# Patient Record
Sex: Female | Born: 1935
Health system: Southern US, Community
[De-identification: ages and names within clinical notes are randomized; demographics above are authoritative.]

## PROBLEM LIST (undated history)

## (undated) DIAGNOSIS — N189 Chronic kidney disease, unspecified: Secondary | ICD-10-CM

## (undated) DIAGNOSIS — N289 Disorder of kidney and ureter, unspecified: Secondary | ICD-10-CM

## (undated) DIAGNOSIS — E079 Disorder of thyroid, unspecified: Secondary | ICD-10-CM

## (undated) DIAGNOSIS — I10 Essential (primary) hypertension: Secondary | ICD-10-CM

## (undated) DIAGNOSIS — C679 Malignant neoplasm of bladder, unspecified: Secondary | ICD-10-CM

## (undated) DIAGNOSIS — C029 Malignant neoplasm of tongue, unspecified: Secondary | ICD-10-CM

## (undated) DIAGNOSIS — R011 Cardiac murmur, unspecified: Secondary | ICD-10-CM

## (undated) DIAGNOSIS — H409 Unspecified glaucoma: Secondary | ICD-10-CM

## (undated) HISTORY — DX: Disorder of thyroid, unspecified: E07.9

## (undated) HISTORY — DX: Malignant neoplasm of tongue, unspecified: C02.9

## (undated) HISTORY — PX: OTHER SURGICAL HISTORY: SHX169

## (undated) HISTORY — DX: Cardiac murmur, unspecified: R01.1

## (undated) HISTORY — DX: Unspecified glaucoma: H40.9

## (undated) HISTORY — DX: Chronic kidney disease, unspecified: N18.9

## (undated) HISTORY — DX: Essential (primary) hypertension: I10

---

## 1989-07-06 DIAGNOSIS — C679 Malignant neoplasm of bladder, unspecified: Secondary | ICD-10-CM

## 1989-07-06 HISTORY — DX: Malignant neoplasm of bladder, unspecified: C67.9

## 1989-07-06 HISTORY — PX: TRANSURETHRAL RESECTION OF BLADDER: SUR1395

## 2004-04-22 ENCOUNTER — Ambulatory Visit: Payer: Self-pay | Admitting: Family Medicine

## 2004-04-24 ENCOUNTER — Ambulatory Visit: Payer: Self-pay | Admitting: Nurse Practitioner

## 2004-05-01 ENCOUNTER — Ambulatory Visit: Payer: Self-pay | Admitting: General Surgery

## 2004-05-01 LAB — HM COLONOSCOPY

## 2004-06-04 ENCOUNTER — Ambulatory Visit: Payer: Self-pay | Admitting: Internal Medicine

## 2004-07-06 HISTORY — PX: COLONOSCOPY: SHX174

## 2005-06-16 ENCOUNTER — Ambulatory Visit: Payer: Self-pay | Admitting: Family Medicine

## 2006-06-22 ENCOUNTER — Ambulatory Visit: Payer: Self-pay

## 2007-06-15 LAB — HM PAP SMEAR: HM PAP: NEGATIVE

## 2007-07-12 ENCOUNTER — Ambulatory Visit: Payer: Self-pay | Admitting: Family Medicine

## 2008-07-18 ENCOUNTER — Ambulatory Visit: Payer: Self-pay

## 2008-09-03 ENCOUNTER — Ambulatory Visit: Payer: Self-pay

## 2009-09-05 ENCOUNTER — Ambulatory Visit: Payer: Self-pay | Admitting: Family Medicine

## 2010-11-04 ENCOUNTER — Ambulatory Visit: Payer: Self-pay | Admitting: Family Medicine

## 2012-02-02 ENCOUNTER — Ambulatory Visit: Payer: Self-pay | Admitting: Family Medicine

## 2012-10-14 DIAGNOSIS — H348192 Central retinal vein occlusion, unspecified eye, stable: Secondary | ICD-10-CM | POA: Insufficient documentation

## 2012-10-14 DIAGNOSIS — H4050X0 Glaucoma secondary to other eye disorders, unspecified eye, stage unspecified: Secondary | ICD-10-CM | POA: Insufficient documentation

## 2012-10-14 DIAGNOSIS — H269 Unspecified cataract: Secondary | ICD-10-CM | POA: Insufficient documentation

## 2012-10-21 ENCOUNTER — Ambulatory Visit: Payer: Self-pay | Admitting: Ophthalmology

## 2012-10-31 ENCOUNTER — Ambulatory Visit: Payer: Self-pay | Admitting: Ophthalmology

## 2013-02-03 DIAGNOSIS — I10 Essential (primary) hypertension: Secondary | ICD-10-CM | POA: Insufficient documentation

## 2013-02-03 DIAGNOSIS — H9190 Unspecified hearing loss, unspecified ear: Secondary | ICD-10-CM | POA: Insufficient documentation

## 2013-04-10 ENCOUNTER — Ambulatory Visit: Payer: Self-pay | Admitting: Family Medicine

## 2013-07-06 HISTORY — PX: CATARACT EXTRACTION: SUR2

## 2013-11-24 ENCOUNTER — Ambulatory Visit: Payer: Self-pay | Admitting: Family Medicine

## 2013-11-24 LAB — CBC AND DIFFERENTIAL
HCT: 37 % (ref 36–46)
HEMOGLOBIN: 12.1 g/dL (ref 12.0–16.0)
Platelets: 304 10*3/uL (ref 150–399)
WBC: 7.3 10*3/mL

## 2013-11-24 LAB — TSH: TSH: 1.61 u[IU]/mL (ref 0.41–5.90)

## 2013-12-07 LAB — BASIC METABOLIC PANEL
BUN: 38 mg/dL — AB (ref 4–21)
Creatinine: 1.4 mg/dL — AB (ref 0.5–1.1)
Glucose: 89 mg/dL
POTASSIUM: 5.5 mmol/L — AB (ref 3.4–5.3)
SODIUM: 134 mmol/L — AB (ref 137–147)

## 2014-04-12 LAB — LIPID PANEL
Cholesterol: 188 mg/dL (ref 0–200)
HDL: 72 mg/dL — AB (ref 35–70)
LDL CALC: 103 mg/dL
TRIGLYCERIDES: 63 mg/dL (ref 40–160)

## 2014-04-26 ENCOUNTER — Ambulatory Visit: Payer: Self-pay | Admitting: Family Medicine

## 2014-04-26 LAB — HM MAMMOGRAPHY

## 2014-04-30 LAB — HM DEXA SCAN

## 2014-05-09 ENCOUNTER — Ambulatory Visit: Payer: Self-pay | Admitting: General Surgery

## 2014-05-16 ENCOUNTER — Ambulatory Visit: Payer: Self-pay | Admitting: Ophthalmology

## 2014-05-29 ENCOUNTER — Ambulatory Visit: Payer: Self-pay | Admitting: General Surgery

## 2014-07-10 DIAGNOSIS — J069 Acute upper respiratory infection, unspecified: Secondary | ICD-10-CM | POA: Diagnosis not present

## 2014-07-18 DIAGNOSIS — J328 Other chronic sinusitis: Secondary | ICD-10-CM | POA: Diagnosis not present

## 2014-07-23 DIAGNOSIS — H4052X3 Glaucoma secondary to other eye disorders, left eye, severe stage: Secondary | ICD-10-CM | POA: Diagnosis not present

## 2014-08-06 DIAGNOSIS — H4011X1 Primary open-angle glaucoma, mild stage: Secondary | ICD-10-CM | POA: Diagnosis not present

## 2014-08-27 ENCOUNTER — Encounter: Payer: Self-pay | Admitting: General Surgery

## 2014-08-28 ENCOUNTER — Ambulatory Visit (INDEPENDENT_AMBULATORY_CARE_PROVIDER_SITE_OTHER): Payer: Medicare Other | Admitting: General Surgery

## 2014-08-28 ENCOUNTER — Encounter: Payer: Self-pay | Admitting: General Surgery

## 2014-08-28 VITALS — BP 120/66 | HR 72 | Resp 12 | Ht 62.0 in | Wt 112.0 lb

## 2014-08-28 DIAGNOSIS — Z1211 Encounter for screening for malignant neoplasm of colon: Secondary | ICD-10-CM | POA: Diagnosis not present

## 2014-08-28 NOTE — Patient Instructions (Addendum)
Patient to return as needed. 

## 2014-08-28 NOTE — Progress Notes (Signed)
Patient ID: Erin Ibarra, female   DOB: 1930-07-05, 81 y.o.   MRN: [id number]  Chief Complaint  Patient presents with  . Other    colonoscopy    HPI Erin Ibarra is a 81 y.o. female here today for a evauation of a screening colonoscopy. Patient states she is not having any GI problems at this time. Her last colonoscopy was in 2005.  HPI  Past Medical History  Diagnosis Date  . Cancer 1991    Bladder  . Glaucoma   . Heart murmur   . Thyroid disease   . Chronic kidney disease   . Hypertension     Past Surgical History  Procedure Laterality Date  . Cataract extraction  2015  . Colonoscopy  2006  . Transurethral resection of bladder  1991    Family History  Problem Relation Age of Onset  . Prostate cancer Father   . Pancreatic cancer Brother   . Lung cancer Brother     Social History History  Substance Use Topics  . Smoking status: Former Smoker -- 1.00 packs/day for 15 years    Types: Cigarettes  . Smokeless tobacco: Not on file  . Alcohol Use: 0.0 oz/week    0 Standard drinks or equivalent per week    No Known Allergies  Current Outpatient Prescriptions  Medication Sig Dispense Refill  . alendronate (FOSAMAX) 70 MG tablet Take 70 mg by mouth once a week. Take with a full glass of water on an empty stomach.    Marland Kitchen aspirin 81 MG tablet Take 81 mg by mouth daily.    . Calcium Carbonate-Vit D-Min (CALCIUM 600 + MINERALS PO) Take 1 tablet by mouth daily.    . carvedilol (COREG) 25 MG tablet Take 25 mg by mouth 2 (two) times daily.  7  . Fish Oil-Cholecalciferol (FISH OIL + D3) 1000-1000 MG-UNIT CAPS Take by mouth 2 (two) times daily.    . hydrochlorothiazide (HYDRODIURIL) 25 MG tablet Take 25 mg by mouth daily.  3  . levothyroxine (SYNTHROID, LEVOTHROID) 75 MCG tablet Take 75 mcg by mouth daily.  5  . Multiple Vitamins-Minerals (MULTIVITAMIN WITH MINERALS) tablet Take 1 tablet by mouth daily.    . valsartan (DIOVAN) 80 MG tablet   4   No current  facility-administered medications for this visit.    Review of Systems Review of Systems  Constitutional: Negative.   Respiratory: Negative.   Cardiovascular: Negative.   Gastrointestinal: Negative.     Blood pressure 120/66, pulse 72, resp. rate 12, height 5\' 2"  (1.575 m), weight 112 lb (50.803 kg).  Physical Exam Physical Exam  Constitutional: She is oriented to person, place, and time. She appears well-developed and well-nourished.  Eyes: Conjunctivae are normal. No scleral icterus.  Neck: Neck supple.  Lymphadenopathy:    She has no cervical adenopathy.  Neurological: She is alert and oriented to person, place, and time.  Skin: Skin is warm and dry.  Right mastectomy site looks clean and healing well.   Data Reviewed Review of the 05/01/2004 colonoscopy showed a 10 mm polyp in the rectum as well as diverticulosis. Pathology showed a hyperplastic polyp.  Assessment    Asymptomatic patient at no increased risk for colon malignancy.    Plan    It has been elected to defer a follow-up colonoscopy based on her age, family history and lack of GI symptoms. Patient to return as needed.      PCP:  Etheleen Mayhew  08/29/2014, 8:30 PM

## 2014-08-29 DIAGNOSIS — Z1211 Encounter for screening for malignant neoplasm of colon: Secondary | ICD-10-CM | POA: Insufficient documentation

## 2014-09-07 DIAGNOSIS — I10 Essential (primary) hypertension: Secondary | ICD-10-CM | POA: Diagnosis not present

## 2014-09-07 DIAGNOSIS — N261 Atrophy of kidney (terminal): Secondary | ICD-10-CM | POA: Diagnosis not present

## 2014-09-07 DIAGNOSIS — N183 Chronic kidney disease, stage 3 (moderate): Secondary | ICD-10-CM | POA: Diagnosis not present

## 2014-12-26 ENCOUNTER — Other Ambulatory Visit: Payer: Self-pay | Admitting: Family Medicine

## 2014-12-26 DIAGNOSIS — E039 Hypothyroidism, unspecified: Secondary | ICD-10-CM | POA: Insufficient documentation

## 2015-04-12 DIAGNOSIS — H43811 Vitreous degeneration, right eye: Secondary | ICD-10-CM | POA: Diagnosis not present

## 2015-04-23 ENCOUNTER — Other Ambulatory Visit: Payer: Self-pay | Admitting: Family Medicine

## 2015-04-23 DIAGNOSIS — E039 Hypothyroidism, unspecified: Secondary | ICD-10-CM

## 2015-04-23 NOTE — Telephone Encounter (Signed)
Last OV 07/2014

## 2015-05-10 DIAGNOSIS — H43811 Vitreous degeneration, right eye: Secondary | ICD-10-CM | POA: Diagnosis not present

## 2015-05-19 ENCOUNTER — Other Ambulatory Visit: Payer: Self-pay | Admitting: Family Medicine

## 2015-05-19 DIAGNOSIS — M810 Age-related osteoporosis without current pathological fracture: Secondary | ICD-10-CM

## 2015-05-20 DIAGNOSIS — M810 Age-related osteoporosis without current pathological fracture: Secondary | ICD-10-CM | POA: Insufficient documentation

## 2015-05-20 NOTE — Telephone Encounter (Signed)
Last acute ov was on 07/18/14. Osteoporosis 04/30/14

## 2015-06-12 DIAGNOSIS — H348122 Central retinal vein occlusion, left eye, stable: Secondary | ICD-10-CM | POA: Diagnosis not present

## 2015-06-12 DIAGNOSIS — I519 Heart disease, unspecified: Secondary | ICD-10-CM | POA: Diagnosis not present

## 2015-06-12 DIAGNOSIS — Q231 Congenital insufficiency of aortic valve: Secondary | ICD-10-CM | POA: Diagnosis not present

## 2015-06-12 DIAGNOSIS — R0609 Other forms of dyspnea: Secondary | ICD-10-CM | POA: Diagnosis not present

## 2015-06-12 DIAGNOSIS — I10 Essential (primary) hypertension: Secondary | ICD-10-CM | POA: Diagnosis not present

## 2015-07-19 ENCOUNTER — Other Ambulatory Visit: Payer: Self-pay | Admitting: Family Medicine

## 2015-07-19 DIAGNOSIS — E039 Hypothyroidism, unspecified: Secondary | ICD-10-CM

## 2015-07-19 NOTE — Telephone Encounter (Signed)
Last OV 07/2014  Thanks,   -Laura  

## 2015-07-26 ENCOUNTER — Other Ambulatory Visit: Payer: Self-pay | Admitting: Family Medicine

## 2015-07-26 DIAGNOSIS — I10 Essential (primary) hypertension: Secondary | ICD-10-CM

## 2015-07-26 NOTE — Telephone Encounter (Signed)
Last OV 07/2014  Thanks,   -Laura  

## 2015-08-12 DIAGNOSIS — H4052X3 Glaucoma secondary to other eye disorders, left eye, severe stage: Secondary | ICD-10-CM | POA: Diagnosis not present

## 2015-08-19 DIAGNOSIS — H401112 Primary open-angle glaucoma, right eye, moderate stage: Secondary | ICD-10-CM | POA: Diagnosis not present

## 2015-08-31 ENCOUNTER — Other Ambulatory Visit: Payer: Self-pay | Admitting: Family Medicine

## 2015-09-03 ENCOUNTER — Other Ambulatory Visit: Payer: Self-pay | Admitting: Family Medicine

## 2015-09-03 DIAGNOSIS — I10 Essential (primary) hypertension: Secondary | ICD-10-CM

## 2015-09-03 MED ORDER — HYDROCHLOROTHIAZIDE 25 MG PO TABS: 25.0000 mg | ORAL_TABLET | Freq: Every day | ORAL | Status: DC

## 2015-09-03 NOTE — Telephone Encounter (Signed)
Pt contacted office for refill request on the following medications:  hydrochlorothiazide (HYDRODIURIL) 25 MG tablet.  [facility].  [phone]  Pt states she is completely out and has an appointment for Monday 09/09/2015.

## 2015-09-06 DIAGNOSIS — N951 Menopausal and female climacteric states: Secondary | ICD-10-CM | POA: Insufficient documentation

## 2015-09-06 DIAGNOSIS — E871 Hypo-osmolality and hyponatremia: Secondary | ICD-10-CM | POA: Insufficient documentation

## 2015-09-06 DIAGNOSIS — L989 Disorder of the skin and subcutaneous tissue, unspecified: Secondary | ICD-10-CM | POA: Insufficient documentation

## 2015-09-06 DIAGNOSIS — Q231 Congenital insufficiency of aortic valve: Secondary | ICD-10-CM | POA: Insufficient documentation

## 2015-09-06 DIAGNOSIS — Q2381 Bicuspid aortic valve: Secondary | ICD-10-CM | POA: Insufficient documentation

## 2015-09-06 DIAGNOSIS — M542 Cervicalgia: Secondary | ICD-10-CM | POA: Insufficient documentation

## 2015-09-06 DIAGNOSIS — K635 Polyp of colon: Secondary | ICD-10-CM | POA: Insufficient documentation

## 2015-09-06 DIAGNOSIS — R0681 Apnea, not elsewhere classified: Secondary | ICD-10-CM | POA: Insufficient documentation

## 2015-09-06 DIAGNOSIS — K5792 Diverticulitis of intestine, part unspecified, without perforation or abscess without bleeding: Secondary | ICD-10-CM | POA: Insufficient documentation

## 2015-09-06 DIAGNOSIS — K5790 Diverticulosis of intestine, part unspecified, without perforation or abscess without bleeding: Secondary | ICD-10-CM | POA: Insufficient documentation

## 2015-09-06 DIAGNOSIS — R413 Other amnesia: Secondary | ICD-10-CM | POA: Insufficient documentation

## 2015-09-06 DIAGNOSIS — H53009 Unspecified amblyopia, unspecified eye: Secondary | ICD-10-CM | POA: Insufficient documentation

## 2015-09-06 DIAGNOSIS — E7800 Pure hypercholesterolemia, unspecified: Secondary | ICD-10-CM | POA: Insufficient documentation

## 2015-09-06 DIAGNOSIS — C679 Malignant neoplasm of bladder, unspecified: Secondary | ICD-10-CM | POA: Insufficient documentation

## 2015-09-06 DIAGNOSIS — R238 Other skin changes: Secondary | ICD-10-CM

## 2015-09-06 DIAGNOSIS — M549 Dorsalgia, unspecified: Secondary | ICD-10-CM | POA: Insufficient documentation

## 2015-09-09 ENCOUNTER — Ambulatory Visit (INDEPENDENT_AMBULATORY_CARE_PROVIDER_SITE_OTHER): Payer: Medicare Other | Admitting: Family Medicine

## 2015-09-09 ENCOUNTER — Encounter: Payer: Self-pay | Admitting: Family Medicine

## 2015-09-09 VITALS — BP 108/58 | HR 72 | Temp 97.8°F | Resp 16 | Wt 111.0 lb

## 2015-09-09 DIAGNOSIS — E875 Hyperkalemia: Secondary | ICD-10-CM | POA: Insufficient documentation

## 2015-09-09 DIAGNOSIS — I10 Essential (primary) hypertension: Secondary | ICD-10-CM

## 2015-09-09 DIAGNOSIS — I509 Heart failure, unspecified: Secondary | ICD-10-CM

## 2015-09-09 DIAGNOSIS — I130 Hypertensive heart and chronic kidney disease with heart failure and stage 1 through stage 4 chronic kidney disease, or unspecified chronic kidney disease: Secondary | ICD-10-CM | POA: Diagnosis not present

## 2015-09-09 DIAGNOSIS — N183 Chronic kidney disease, stage 3 (moderate): Secondary | ICD-10-CM

## 2015-09-09 DIAGNOSIS — R5383 Other fatigue: Secondary | ICD-10-CM | POA: Diagnosis not present

## 2015-09-09 DIAGNOSIS — E039 Hypothyroidism, unspecified: Secondary | ICD-10-CM

## 2015-09-09 MED ORDER — HYDROCHLOROTHIAZIDE 25 MG PO TABS: 25.0000 mg | ORAL_TABLET | Freq: Every day | ORAL | Status: DC

## 2015-09-09 NOTE — Progress Notes (Signed)
Subjective:    Patient ID: Erin Ibarra, female    DOB: 1930-03-28, 82 y.o.   MRN: [id number]  Hypertension This is a chronic problem. The problem has been waxing and waning since onset. Associated symptoms include anxiety (slightly), blurred vision, malaise/fatigue and shortness of breath (with exertion). Pertinent negatives include no chest pain, headaches, neck pain, orthopnea, palpitations or peripheral edema.   Hypothyroidism Erin Ibarra is a 82 y.o. female who presents for follow up of hypothyroidism. Current symptoms: change in energy level, heat / cold intolerance and weight changes . Patient denies diarrhea, nervousness and palpitations. Symptoms have gradually worsened. Last TSH was checked 11/24/2013 and was 1.610. Pt is currently taking Levothyroxine 75 mcg.     Review of Systems  Constitutional: Positive for malaise/fatigue, activity change, fatigue and unexpected weight change (pt has recently changed diet to decrease sodium. Pt states she has lost 10 pounds in the last year). Negative for fever and appetite change.  Eyes: Positive for blurred vision.  Respiratory: Positive for shortness of breath (with exertion).   Cardiovascular: Negative for chest pain, palpitations and orthopnea.  Endocrine: Positive for cold intolerance.  Musculoskeletal: Negative for neck pain.  Neurological: Negative for headaches.   BP 108/58 mmHg  Pulse 72  Temp(Src) 97.8 F (36.6 C) (Oral)  Resp 16  Wt 111 lb (50.349 kg)   Patient Active Problem List   Diagnosis Date Noted  . Amblyopia 09/06/2015  . Back ache 09/06/2015  . Aortic valve, bicuspid 09/06/2015  . Bladder cancer ([place]) 09/06/2015  . Colon polyp 09/06/2015  . DD (diverticular disease) 09/06/2015  . Breathlessness on exertion 09/06/2015  . Hypercholesteremia 09/06/2015  . Below normal amount of sodium in the blood 09/06/2015  . Cervical pain 09/06/2015  . Amnesia 09/06/2015  . Post menopausal syndrome  09/06/2015  . Disorder of scalp 09/06/2015  . OP (osteoporosis) 05/20/2015  . Hypothyroidism 12/26/2014  . Encounter for screening colonoscopy 08/29/2014  . BP (high blood pressure) 02/03/2013  . Difficulty hearing 02/03/2013  . Cataract 10/14/2012  . Central retinal vein occlusion 10/14/2012  . 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 Cardiac failure ([place]) 06/21/2008   Past Medical History  Diagnosis Date  . Cancer [facility]) 1991    Bladder  . Glaucoma   . Heart murmur   . Thyroid disease   . Chronic kidney disease   . Hypertension    Current Outpatient Prescriptions on File Prior to Visit  Medication Sig  . alendronate (FOSAMAX) 70 MG tablet 1 (ONE) TABLET, ORAL, WEEKLY  . aspirin 81 MG tablet Take 81 mg by mouth daily.  . bimatoprost (LUMIGAN) 0.01 % SOLN Apply 1 drop to eye daily.  . Calcium Carbonate-Vit D-Min (CALCIUM 600 + MINERALS PO) Take 1 tablet by mouth daily.  . carvedilol (COREG) 25 MG tablet Take 25 mg by mouth 2 (two) times daily.  . Cyanocobalamin (VITAMIN B12 PO) Take 1 tablet by mouth daily.  . dorzolamide-timolol (COSOPT) 22.3-6.8 MG/ML ophthalmic solution USE 1 DROP(S) IN BOTH EYES 2 TIMES A DAY  . Fish Oil-Cholecalciferol (FISH OIL + D3) 1000-1000 MG-UNIT CAPS Take by mouth 2 (two) times daily.  . hydrochlorothiazide (HYDRODIURIL) 25 MG tablet Take 1 tablet (25 mg total) by mouth daily.  Marland Kitchen levothyroxine (SYNTHROID, LEVOTHROID) 75 MCG tablet TAKE 1 TABLET BY MOUTH DAILY  . Multiple Vitamins-Minerals (MULTIVITAMIN WITH MINERALS) tablet Take 1 tablet by mouth daily.  . valsartan (DIOVAN) 80 MG tablet    No current facility-administered medications on file  prior to visit.   No Known Allergies Past Surgical History  Procedure Laterality Date  . Cataract extraction  2015  . Colonoscopy  2006  . Transurethral resection of bladder  1991   Social History   Social History  . Marital Status: Widowed    Spouse Name: N/A  . Number of Children: N/A  . Years of  Education: N/A   Occupational History  . Not on file.   Social History Main Topics  . Smoking status: Former Smoker -- 1.00 packs/day for 15 years    Types: Cigarettes  . Smokeless tobacco: Not on file  . Alcohol Use: 0.0 oz/week    0 Standard drinks or equivalent per week     Comment: 1 glass of wine qhs  . Drug Use: No  . Sexual Activity: Not on file   Other Topics Concern  . Not on file   Social History Narrative   Family History  Problem Relation Age of Onset  . Prostate cancer Father   . Cancer Father     prostate  . Heart disease Father     MI  . Pancreatic cancer Brother   . Lung cancer Brother   . Cancer Brother     brain, lung  . Heart disease Brother     MI       Objective:   Physical Exam  Constitutional: She appears well-developed and well-nourished.  Cardiovascular: Normal rate and regular rhythm.   Pulmonary/Chest: Effort normal and breath sounds normal. No respiratory distress.  Psychiatric: She has a normal mood and affect. Her behavior is normal.   BP 108/58 mmHg  Pulse 72  Temp(Src) 97.8 F (36.6 C) (Oral)  Resp 16  Wt 111 lb (50.349 kg)     Assessment & Plan:  1. Essential hypertension Too low. Complicated by CHF CKD stage 3 and hyperkalemia. Will refer back to Dr. Saralyn Pilar for medication adjustment.   - Comprehensive metabolic panel  2. Hypothyroidism, unspecified hypothyroidism type Check Ibarra and FU pending results. - TSH  3. Other fatigue Worsening. Will check Ibarra. FU pending results. - CBC with Differential/Platelet    Patient seen and examined by Jerrell Belfast, MD, and note scribed by Renaldo Fiddler, CMA.   I have reviewed the document for accuracy and completeness and I agree with above. Jerrell Belfast, MD   Margarita Rana, MD

## 2015-09-10 ENCOUNTER — Telehealth: Payer: Self-pay

## 2015-09-10 LAB — COMPREHENSIVE METABOLIC PANEL
ALK PHOS: 64 IU/L (ref 39–117)
ALT: 11 IU/L (ref 0–32)
AST: 13 IU/L (ref 0–40)
Albumin/Globulin Ratio: 1.7 (ref 1.1–2.5)
Albumin: 4 g/dL (ref 3.5–4.7)
BUN/Creatinine Ratio: 26 (ref 11–26)
BUN: 35 mg/dL — ABNORMAL HIGH (ref 8–27)
Bilirubin Total: 0.3 mg/dL (ref 0.0–1.2)
CO2: 25 mmol/L (ref 18–29)
CREATININE: 1.34 mg/dL — AB (ref 0.57–1.00)
Calcium: 9.2 mg/dL (ref 8.7–10.3)
Chloride: 94 mmol/L — ABNORMAL LOW (ref 96–106)
GFR calc Af Amer: 42 mL/min/{1.73_m2} — ABNORMAL LOW (ref >59)
GFR calc non Af Amer: 36 mL/min/{1.73_m2} — ABNORMAL LOW (ref >59)
GLOBULIN, TOTAL: 2.4 g/dL (ref 1.5–4.5)
Glucose: 67 mg/dL (ref 65–99)
POTASSIUM: 4.6 mmol/L (ref 3.5–5.2)
SODIUM: 136 mmol/L (ref 134–144)
Total Protein: 6.4 g/dL (ref 6.0–8.5)

## 2015-09-10 LAB — CBC WITH DIFFERENTIAL/PLATELET
BASOS ABS: 0 10*3/uL (ref 0.0–0.2)
Basos: 0 %
EOS (ABSOLUTE): 0.1 10*3/uL (ref 0.0–0.4)
EOS: 1 %
HEMATOCRIT: 37.5 % (ref 34.0–46.6)
Hemoglobin: 12.2 g/dL (ref 11.1–15.9)
Immature Grans (Abs): 0 10*3/uL (ref 0.0–0.1)
Immature Granulocytes: 0 %
LYMPHS ABS: 2 10*3/uL (ref 0.7–3.1)
Lymphs: 24 %
MCH: 30.8 pg (ref 26.6–33.0)
MCHC: 32.5 g/dL (ref 31.5–35.7)
MCV: 95 fL (ref 79–97)
MONOS ABS: 0.9 10*3/uL (ref 0.1–0.9)
Monocytes: 11 %
Neutrophils Absolute: 5.4 10*3/uL (ref 1.4–7.0)
Neutrophils: 64 %
PLATELETS: 315 10*3/uL (ref 150–379)
RBC: 3.96 x10E6/uL (ref 3.77–5.28)
RDW: 12.9 % (ref 12.3–15.4)
WBC: 8.5 10*3/uL (ref 3.4–10.8)

## 2015-09-10 LAB — TSH: TSH: 1.1 u[IU]/mL (ref 0.450–4.500)

## 2015-09-10 NOTE — Telephone Encounter (Signed)
-----   Message from Margarita Rana, MD sent at 09/10/2015  7:09 AM EST ----- Labs stable.  Kidney function stable. Potassium normal.  Please notify patient. Thanks.

## 2015-09-10 NOTE — Telephone Encounter (Signed)
Advised pt of lab results. Pt verbally acknowledges understanding. Erin Ibarra, CMA   

## 2015-09-11 DIAGNOSIS — H348122 Central retinal vein occlusion, left eye, stable: Secondary | ICD-10-CM | POA: Diagnosis not present

## 2015-09-11 DIAGNOSIS — Q231 Congenital insufficiency of aortic valve: Secondary | ICD-10-CM | POA: Diagnosis not present

## 2015-09-11 DIAGNOSIS — N261 Atrophy of kidney (terminal): Secondary | ICD-10-CM | POA: Diagnosis not present

## 2015-09-11 DIAGNOSIS — I10 Essential (primary) hypertension: Secondary | ICD-10-CM | POA: Diagnosis not present

## 2015-09-11 DIAGNOSIS — I130 Hypertensive heart and chronic kidney disease with heart failure and stage 1 through stage 4 chronic kidney disease, or unspecified chronic kidney disease: Secondary | ICD-10-CM | POA: Diagnosis not present

## 2015-09-11 DIAGNOSIS — H348192 Central retinal vein occlusion, unspecified eye, stable: Secondary | ICD-10-CM | POA: Diagnosis not present

## 2015-09-11 DIAGNOSIS — N183 Chronic kidney disease, stage 3 (moderate): Secondary | ICD-10-CM | POA: Diagnosis not present

## 2015-09-19 DIAGNOSIS — H401112 Primary open-angle glaucoma, right eye, moderate stage: Secondary | ICD-10-CM | POA: Diagnosis not present

## 2015-10-05 ENCOUNTER — Other Ambulatory Visit: Payer: Self-pay | Admitting: Family Medicine

## 2015-10-05 DIAGNOSIS — I10 Essential (primary) hypertension: Secondary | ICD-10-CM

## 2015-10-23 ENCOUNTER — Ambulatory Visit (INDEPENDENT_AMBULATORY_CARE_PROVIDER_SITE_OTHER): Payer: Medicare Other | Admitting: Family Medicine

## 2015-10-23 ENCOUNTER — Encounter: Payer: Self-pay | Admitting: Family Medicine

## 2015-10-23 VITALS — BP 162/66 | HR 80 | Temp 98.8°F | Resp 16 | Wt 108.0 lb

## 2015-10-23 DIAGNOSIS — J069 Acute upper respiratory infection, unspecified: Secondary | ICD-10-CM

## 2015-10-23 DIAGNOSIS — J309 Allergic rhinitis, unspecified: Secondary | ICD-10-CM | POA: Diagnosis not present

## 2015-10-23 DIAGNOSIS — J40 Bronchitis, not specified as acute or chronic: Secondary | ICD-10-CM | POA: Diagnosis not present

## 2015-10-23 MED ORDER — PREDNISONE 10 MG PO TABS: ORAL_TABLET | ORAL | Status: DC

## 2015-10-23 MED ORDER — AZITHROMYCIN 250 MG PO TABS: ORAL_TABLET | ORAL | Status: DC

## 2015-10-23 MED ORDER — LEVALBUTEROL HCL 0.63 MG/3ML IN NEBU
0.6300 mg | INHALATION_SOLUTION | Freq: Once | RESPIRATORY_TRACT | Status: AC
  Administered 2015-10-23: 0.63 mg via RESPIRATORY_TRACT

## 2015-10-23 MED ORDER — CETIRIZINE HCL 10 MG PO TABS: 10.0000 mg | ORAL_TABLET | Freq: Every day | ORAL | Status: DC

## 2015-10-23 NOTE — Progress Notes (Signed)
Patient ID: [name], female   DOB: 05/21/1930, 82 y.o.   MRN: [id number]         Patient: Erin Ibarra Female    DOB: 02-12-1930   82 y.o.   MRN: [id number] Visit Date: 10/23/2015  Today's Provider: Margarita Rana, MD   Chief Complaint  Patient presents with  . URI   Subjective:    URI  This is a new problem. The current episode started 1 to 4 weeks ago. The problem has been gradually worsening. There has been no fever. Associated symptoms include abdominal pain (From coughing.), coughing, diarrhea (Only on episode), nausea (? Secondary to Postnasal drip) and wheezing. Pertinent negatives include no congestion, ear pain, headaches, neck pain, plugged ear sensation, rhinorrhea, sinus pain, sneezing, sore throat or vomiting. She has tried decongestant for the symptoms. The treatment provided no relief.      No Known Allergies Previous Medications   ALENDRONATE (FOSAMAX) 70 MG TABLET    1 (ONE) TABLET, ORAL, WEEKLY   ASPIRIN 81 MG TABLET    Take 81 mg by mouth daily.   BIMATOPROST (LUMIGAN) 0.01 % SOLN    Apply 1 drop to eye daily.   CALCIUM CARBONATE-VIT D-MIN (CALCIUM 600 + MINERALS PO)    Take 1 tablet by mouth daily.   CARVEDILOL (COREG) 25 MG TABLET    Take 25 mg by mouth 2 (two) times daily.   CYANOCOBALAMIN (VITAMIN B12 PO)    Take 1 tablet by mouth daily.   DORZOLAMIDE-TIMOLOL (COSOPT) 22.3-6.8 MG/ML OPHTHALMIC SOLUTION    USE 1 DROP(S) IN BOTH EYES 2 TIMES A DAY   FISH OIL-CHOLECALCIFEROL (FISH OIL + D3) 1000-1000 MG-UNIT CAPS    Take by mouth 2 (two) times daily.   HYDROCHLOROTHIAZIDE (HYDRODIURIL) 25 MG TABLET    Take 1 tablet (25 mg total) by mouth daily.   LEVOTHYROXINE (SYNTHROID, LEVOTHROID) 75 MCG TABLET    TAKE 1 TABLET BY MOUTH DAILY   MULTIPLE VITAMINS-MINERALS (MULTIVITAMIN WITH MINERALS) TABLET    Take 1 tablet by mouth daily.   VALSARTAN (DIOVAN) 80 MG TABLET        Review of Systems  Constitutional: Positive for fatigue. Negative for fever,  chills, diaphoresis, activity change, appetite change and unexpected weight change.  HENT: Positive for postnasal drip. Negative for congestion, ear discharge, ear pain, nosebleeds, rhinorrhea, sinus pressure, sneezing, sore throat, tinnitus, trouble swallowing and voice change.   Eyes: Negative.   Respiratory: Positive for cough, chest tightness, shortness of breath and wheezing. Negative for apnea, choking and stridor.   Gastrointestinal: Positive for nausea (? Secondary to Postnasal drip), abdominal pain (From coughing.) and diarrhea (Only on episode). Negative for vomiting, constipation, blood in stool, abdominal distention, anal bleeding and rectal pain.  Musculoskeletal: Negative for myalgias, back pain, joint swelling, arthralgias, neck pain and neck stiffness.  Neurological: Negative for dizziness, light-headedness and headaches.    Social History  Substance Use Topics  . Smoking status: Former Smoker -- 1.00 packs/day for 15 years    Types: Cigarettes  . Smokeless tobacco: Not on file  . Alcohol Use: 0.0 oz/week    0 Standard drinks or equivalent per week     Comment: 1 glass of wine qhs   Objective:   BP 162/66 mmHg  Pulse 80  Temp(Src) 98.8 F (37.1 C) (Oral)  Resp 16  Wt 108 lb (48.988 kg)  Physical Exam  Constitutional: She is oriented to person, place, and time. She appears well-developed and well-nourished.  Cardiovascular:  Normal rate and regular rhythm.   Pulmonary/Chest: Effort normal and breath sounds normal.  Neurological: She is alert and oriented to person, place, and time.  Skin: Skin is warm and dry.  Psychiatric: She has a normal mood and affect. Her behavior is normal. Judgment and thought content normal.      Assessment & Plan:     1. Bronchitis Worsening will treat as below.  Nebulizer treatment in the office.  Pt reports some improvement.  Pt advised to call if not improved.   - levalbuterol (XOPENEX) nebulizer solution 0.63 mg; Take 3 mLs (0.63 mg  total) by nebulization once. - predniSONE (DELTASONE) 10 MG tablet; 6 tabs for 1day; 5 tabs for 1 day; 4 tabs for 1 day; 3 tabs for 1 day; 2 tabs for 1 day; 1 tab for 1 day.  Dispense: 21 tablet; Refill: 0 - azithromycin (ZITHROMAX) 250 MG tablet; Take two tablets for one day and decrease to one a day unit gone.  Dispense: 6 tablet; Refill: 0  2. Allergic rhinitis, unspecified allergic rhinitis type Worsening will try Zyrtec at night as needed.  - cetirizine (ZYRTEC) 10 MG tablet; Take 1 tablet (10 mg total) by mouth at bedtime.  Dispense: 90 tablet; Refill: 1  Patient was seen and examined by Jerrell Belfast, MD, and note scribed by Ashley Royalty, CMA.  I have reviewed the document for accuracy and completeness and I agree with above. - Jerrell Belfast, MD     Margarita Rana, MD  [facility]

## 2015-10-29 ENCOUNTER — Telehealth: Payer: Self-pay | Admitting: Family Medicine

## 2015-10-29 NOTE — Telephone Encounter (Signed)
Advised patient as below.  

## 2015-10-29 NOTE — Telephone Encounter (Signed)
Pt was in the office about a week ago with bronchitis.  She got better but now is coughing again.

## 2015-10-29 NOTE — Telephone Encounter (Signed)
Mucinex 600 mg bid, OV to recheck if worsens or does not improve. Thanks.

## 2015-10-29 NOTE — Telephone Encounter (Signed)
Pt has finished her medication.  Not really coughing up anything just feels like mucus is still there.  Head congestion also.  Please advise  [phone].  Thanks Con Memos

## 2015-10-29 NOTE — Telephone Encounter (Signed)
Patient denies fever. Main compliant is cough that is becoming more persistent. Any recommendations?

## 2015-12-11 DIAGNOSIS — Q231 Congenital insufficiency of aortic valve: Secondary | ICD-10-CM | POA: Diagnosis not present

## 2015-12-11 DIAGNOSIS — I10 Essential (primary) hypertension: Secondary | ICD-10-CM | POA: Diagnosis not present

## 2015-12-11 DIAGNOSIS — H348122 Central retinal vein occlusion, left eye, stable: Secondary | ICD-10-CM | POA: Diagnosis not present

## 2015-12-11 DIAGNOSIS — I5022 Chronic systolic (congestive) heart failure: Secondary | ICD-10-CM | POA: Diagnosis not present

## 2015-12-11 DIAGNOSIS — I519 Heart disease, unspecified: Secondary | ICD-10-CM | POA: Diagnosis not present

## 2016-01-03 ENCOUNTER — Other Ambulatory Visit: Payer: Self-pay | Admitting: Family Medicine

## 2016-01-03 DIAGNOSIS — E039 Hypothyroidism, unspecified: Secondary | ICD-10-CM

## 2016-01-22 ENCOUNTER — Encounter: Payer: Medicare Other | Admitting: Family Medicine

## 2016-01-29 ENCOUNTER — Encounter: Payer: Self-pay | Admitting: Family Medicine

## 2016-01-29 ENCOUNTER — Ambulatory Visit (INDEPENDENT_AMBULATORY_CARE_PROVIDER_SITE_OTHER): Payer: Medicare Other | Admitting: Family Medicine

## 2016-01-29 VITALS — BP 116/68 | HR 76 | Temp 97.6°F | Resp 16 | Ht 61.0 in | Wt 109.0 lb

## 2016-01-29 DIAGNOSIS — Z1239 Encounter for other screening for malignant neoplasm of breast: Secondary | ICD-10-CM | POA: Diagnosis not present

## 2016-01-29 DIAGNOSIS — Z0000 Encounter for general adult medical examination without abnormal findings: Secondary | ICD-10-CM | POA: Diagnosis not present

## 2016-01-29 NOTE — Progress Notes (Signed)
Patient: Erin Ibarra, Female    DOB: [birth year], 82 y.o.   MRN: [id number] Visit Date: 01/29/2016  Today's Provider: Wilhemena Durie, MD   Chief Complaint  Patient presents with  . Medicare Wellness   Subjective:   Erin Ibarra is a 82 y.o. female who presents today for her Subsequent Annual Wellness Visit. She feels well. She reports she is not exercising. She reports she is sleeping well. She complains of chronic neck and back pain and right hip pain. The main thing that bothers her lately he has blindness in the left eye and peripheral visual defects that laterally of the right eye. Colonoscopy- 05/01/04 Diverticulosis, hyperplastic polyps Pap- 06/15/07- normal  Mammogram- 04/26/14- normal BMD- 04/26/14 Some osteopenia   Immunization History  Administered Date(s) Administered  . Influenza, High Dose Seasonal PF 04/06/2015  . Pneumococcal Conjugate-13 04/06/2014  . Pneumococcal Polysaccharide-23 04/30/1997  . Td 03/06/2004, 08/04/2013  . Tdap 08/04/2013  . Zoster 09/20/2008     Review of Systems  Constitutional: Negative.   HENT: Positive for rhinorrhea and tinnitus.   Eyes: Positive for photophobia, redness and visual disturbance.  Respiratory: Negative.   Cardiovascular: Negative.   Gastrointestinal: Negative.   Endocrine: Negative.   Genitourinary: Negative.   Musculoskeletal: Negative.   Skin: Negative.   Allergic/Immunologic: Negative.   Neurological: Negative.   Hematological: Bruises/bleeds easily.  Psychiatric/Behavioral: Negative.     Patient Active Problem List   Diagnosis Date Noted  . Benign hypertensive heart and CKD, stage 3 (GFR 30-59), w CHF ([place]) 09/09/2015  . Hyperkalemia 09/09/2015  . Amblyopia 09/06/2015  . Back ache 09/06/2015  . Aortic valve, bicuspid 09/06/2015  . Bladder cancer ([place]) 09/06/2015  . Colon polyp 09/06/2015  . DD (diverticular disease) 09/06/2015  . Breathlessness on exertion 09/06/2015  .  Hypercholesteremia 09/06/2015  . Below normal amount of sodium in the blood 09/06/2015  . Cervical pain 09/06/2015  . Amnesia 09/06/2015  . Post menopausal syndrome 09/06/2015  . Disorder of scalp 09/06/2015  . Hypo-osmolality and hyponatremia 09/06/2015  . Diverticulitis 09/06/2015  . Aortic regurgitation, congenital 09/06/2015  . OP (osteoporosis) 05/20/2015  . Hypothyroidism 12/26/2014  . Encounter for screening colonoscopy 08/29/2014  . BP (high blood pressure) 02/03/2013  . Difficulty hearing 02/03/2013  . Cataract 10/14/2012  . Central retinal vein occlusion 10/14/2012  . 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 Cardiac failure ([place]) 06/21/2008  . Heart failure ([place]) 06/21/2008    Social History   Social History  . Marital status: Widowed    Spouse name: N/A  . Number of children: N/A  . Years of education: N/A   Occupational History  . Not on file.   Social History Main Topics  . Smoking status: Former Smoker    Packs/day: 1.00    Years: 15.00    Types: Cigarettes  . Smokeless tobacco: Never Used  . Alcohol use 0.0 oz/week     Comment: 1 glass of wine qhs  . Drug use: No  . Sexual activity: Not on file   Other Topics Concern  . Not on file   Social History Narrative  . No narrative on file    Past Surgical History:  Procedure Laterality Date  . CATARACT EXTRACTION  2015  . COLONOSCOPY  2006  . TRANSURETHRAL RESECTION OF BLADDER  1991    Her family history includes Cancer in her brother and father; Heart disease in her brother and father; Lung cancer in her brother; Pancreatic cancer in her brother; Prostate cancer  in her father.    Outpatient Medications Prior to Visit  Medication Sig Dispense Refill  . alendronate (FOSAMAX) 70 MG tablet 1 (ONE) TABLET, ORAL, WEEKLY 4 tablet 10  . aspirin 81 MG tablet Take 81 mg by mouth daily.    . Calcium Carbonate-Vit D-Min (CALCIUM 600 + MINERALS PO) Take 1 tablet by mouth daily.    . carvedilol (COREG) 25 MG  tablet Take 25 mg by mouth 2 (two) times daily.  7  . cetirizine (ZYRTEC) 10 MG tablet Take 1 tablet (10 mg total) by mouth at bedtime. 90 tablet 1  . Cyanocobalamin (VITAMIN B12 PO) Take 1 tablet by mouth daily.    . dorzolamide-timolol (COSOPT) 22.3-6.8 MG/ML ophthalmic solution USE 1 DROP(S) IN BOTH EYES 2 TIMES A DAY  5  . Fish Oil-Cholecalciferol (FISH OIL + D3) 1000-1000 MG-UNIT CAPS Take by mouth 2 (two) times daily.    . hydrochlorothiazide (HYDRODIURIL) 25 MG tablet Take 1 tablet (25 mg total) by mouth daily. 30 tablet 5  . levothyroxine (SYNTHROID, LEVOTHROID) 75 MCG tablet TAKE 1 TABLET BY MOUTH DAILY 90 tablet 3  . Multiple Vitamins-Minerals (MULTIVITAMIN WITH MINERALS) tablet Take 1 tablet by mouth daily.    . valsartan (DIOVAN) 80 MG tablet   4  . azithromycin (ZITHROMAX) 250 MG tablet Take two tablets for one day and decrease to one a day unit gone. (Patient not taking: Reported on 01/29/2016) 6 tablet 0  . bimatoprost (LUMIGAN) 0.01 % SOLN Apply 1 drop to eye daily.    . predniSONE (DELTASONE) 10 MG tablet 6 tabs for 1day; 5 tabs for 1 day; 4 tabs for 1 day; 3 tabs for 1 day; 2 tabs for 1 day; 1 tab for 1 day. (Patient not taking: Reported on 01/29/2016) 21 tablet 0   No facility-administered medications prior to visit.     No Known Allergies  Patient Care Team: Jerrol Banana., MD as PCP - General (Family Medicine) Robert Bellow, MD (General Surgery)  Objective:   Vitals:  Vitals:   01/29/16 0955  BP: 116/68  Pulse: 76  Resp: 16  Temp: 97.6 F (36.4 C)  TempSrc: Oral  Weight: 109 lb (49.4 kg)  Height: '5\' 1"'$  (1.549 m)    Physical Exam  Constitutional: She is oriented to person, place, and time. She appears well-developed and well-nourished.  HENT:  Head: Normocephalic and atraumatic.  Right Ear: External ear normal.  Left Ear: External ear normal.  Nose: Nose normal.  Mouth/Throat: Oropharynx is clear and moist.  Eyes: Conjunctivae and EOM are  normal. Pupils are equal, round, and reactive to light.  Neck: Normal range of motion. Neck supple.  Cardiovascular: Normal rate, regular rhythm, normal heart sounds and intact distal pulses.   Pulmonary/Chest: Effort normal and breath sounds normal.  Abdominal: Soft. Bowel sounds are normal.  Musculoskeletal: Normal range of motion. She exhibits tenderness.  Mild tenderness over right greater trochanter of the hip  Neurological: She is alert and oriented to person, place, and time. She has normal reflexes.  Skin: Skin is warm and dry.  Psychiatric: She has a normal mood and affect. Her behavior is normal. Judgment and thought content normal.    Activities of Daily Living In your present state of health, do you have any difficulty performing the following activities: 01/29/2016  Hearing? Y  Vision? Y  Difficulty concentrating or making decisions? N  Walking or climbing stairs? Y  Dressing or bathing? N  Doing errands, shopping? N  Some recent data might be hidden    Fall Risk Assessment Fall Risk  01/29/2016  Falls in the past year? Yes  Number falls in past yr: 1  Injury with Fall? No     Depression Screen PHQ 2/9 Scores 01/29/2016  PHQ - 2 Score 0    Cognitive Testing - 6-CIT    Year: 0 points  Month: 0 points  Memorize "Pia Mau, [address], [place]"  Time (within 1 hour:) 0 points  Count backwards from 20: 0 points  Name months of year: 0 points  Repeat Address:  6 points   Total Score: 6/28  Interpretation : Normal (0-7) Abnormal (8-28)    Assessment & Plan:     Annual Wellness Visit  Reviewed patient's Family Medical History Reviewed and updated list of patient's medical providers Assessment of cognitive impairment was done Assessed patient's functional ability Established a written schedule for health screening [place] Completed and Reviewed             Patient declines DRE or breast exam. No pelvic exam needed. Exercise  Activities and Dietary recommendations Goals    None      Immunization History  Administered Date(s) Administered  . Influenza, High Dose Seasonal PF 04/06/2015  . Pneumococcal Conjugate-13 04/06/2014  . Pneumococcal Polysaccharide-23 04/30/1997  . Td 03/06/2004, 08/04/2013  . Tdap 08/04/2013  . Zoster 09/20/2008    Health Maintenance  Topic Date Due  . INFLUENZA VACCINE  02/04/2016  . TETANUS/TDAP  08/05/2023  . DEXA SCAN  Completed  . ZOSTAVAX  Completed  . PNA vac Low Risk Adult  Completed      Discussed health benefits of physical activity, and encouraged her to engage in regular exercise appropriate for her age and condition.  Chronic neck and back pain/DDD Probable right greater trochanteric bursitis--refer to orthopedic at patient request. 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  Miguel Aschoff MD [facility] 01/29/2016 9:59 AM  ------------------------------------------------------------------------------------------------------------

## 2016-03-31 ENCOUNTER — Other Ambulatory Visit: Payer: Self-pay | Admitting: Family Medicine

## 2016-03-31 DIAGNOSIS — I10 Essential (primary) hypertension: Secondary | ICD-10-CM

## 2016-04-17 ENCOUNTER — Other Ambulatory Visit: Payer: Self-pay | Admitting: Family Medicine

## 2016-04-17 DIAGNOSIS — M810 Age-related osteoporosis without current pathological fracture: Secondary | ICD-10-CM

## 2016-09-22 ENCOUNTER — Other Ambulatory Visit: Payer: Self-pay

## 2016-09-22 DIAGNOSIS — I10 Essential (primary) hypertension: Secondary | ICD-10-CM

## 2016-09-22 MED ORDER — HYDROCHLOROTHIAZIDE 25 MG PO TABS: 25.0000 mg | ORAL_TABLET | Freq: Every day | ORAL | 0 refills | Status: DC

## 2016-09-24 ENCOUNTER — Other Ambulatory Visit: Payer: Self-pay | Admitting: Family Medicine

## 2016-09-24 ENCOUNTER — Ambulatory Visit (INDEPENDENT_AMBULATORY_CARE_PROVIDER_SITE_OTHER): Payer: Medicare Other | Admitting: Family Medicine

## 2016-09-24 VITALS — BP 110/58 | HR 68 | Temp 98.1°F | Resp 18 | Wt 112.0 lb

## 2016-09-24 DIAGNOSIS — M810 Age-related osteoporosis without current pathological fracture: Secondary | ICD-10-CM

## 2016-09-24 DIAGNOSIS — I10 Essential (primary) hypertension: Secondary | ICD-10-CM

## 2016-09-24 NOTE — Progress Notes (Signed)
Erin Ibarra  MRN: [id number] DOB: May 02, 1930  Subjective:  HPI  The patient is an 83 year old female who presents to get authorization to come off of her Fosamax.  She has recently been told by her oral surgeon that she has bone protruding from her lower right jaw.  He would like for her to discontinue her Fosamax.The patient states she had a tooth extraction about 2 months ago and that is about the time she noticed something wrong in her mouth.  She states that the area where the bone is protruding is just behind where she had the tooth pulled.   Patient states she can not remember exactly when she started on the Fosamax.  However she does know that she was on it for 7 years at one time and then was taken off of it.  The patient had her last BMD on 04/26/14.  She thinks this is when she was put back on the medicine. It is of note that the patien has never had any fractures.   Patient Active Problem List   Diagnosis Date Noted  . Benign hypertensive heart and CKD, stage 3 (GFR 30-59), w CHF ([place]) 09/09/2015  . Hyperkalemia 09/09/2015  . Amblyopia 09/06/2015  . Back ache 09/06/2015  . Aortic valve, bicuspid 09/06/2015  . Bladder cancer ([place]) 09/06/2015  . Colon polyp 09/06/2015  . DD (diverticular disease) 09/06/2015  . Breathlessness on exertion 09/06/2015  . Hypercholesteremia 09/06/2015  . Below normal amount of sodium in the blood 09/06/2015  . Cervical pain 09/06/2015  . Amnesia 09/06/2015  . Post menopausal syndrome 09/06/2015  . Disorder of scalp 09/06/2015  . Hypo-osmolality and hyponatremia 09/06/2015  . Diverticulitis 09/06/2015  . Aortic regurgitation, congenital 09/06/2015  . OP (osteoporosis) 05/20/2015  . Hypothyroidism 12/26/2014  . Encounter for screening colonoscopy 08/29/2014  . BP (high blood pressure) 02/03/2013  . Difficulty hearing 02/03/2013  . Cataract 10/14/2012  . Central retinal vein occlusion 10/14/2012  . 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  Cardiac failure ([place]) 06/21/2008  . Heart failure ([place]) 06/21/2008    Past Medical History:  Diagnosis Date  . Cancer [facility]) 1991   Bladder  . Chronic kidney disease   . Glaucoma   . Heart murmur   . Hypertension   . Thyroid disease     Social History   Social History  . Marital status: Widowed    Spouse name: N/A  . Number of children: N/A  . Years of education: N/A   Occupational History  . Not on file.   Social History Main Topics  . Smoking status: Former Smoker    Packs/day: 1.00    Years: 15.00    Types: Cigarettes  . Smokeless tobacco: Never Used  . Alcohol use 0.0 oz/week     Comment: 1 glass of wine qhs  . Drug use: No  . Sexual activity: Not on file   Other Topics Concern  . Not on file   Social History Narrative  . No narrative on file    Outpatient Encounter Prescriptions as of 09/24/2016  Medication Sig Note  . alendronate (FOSAMAX) 70 MG tablet TAKE 1 TABLET BY MOUTH WEEKLY   . aspirin 81 MG tablet Take 81 mg by mouth daily.   . bimatoprost (LUMIGAN) 0.03 % ophthalmic solution 1 drop at bedtime.   . Calcium Carbonate-Vit D-Min (CALCIUM 600 + MINERALS PO) Take 1 tablet by mouth daily.   . carvedilol (COREG) 25 MG tablet Take 25 mg  by mouth 2 (two) times daily. 08/28/2014: Received from: External Pharmacy  . cetirizine (ZYRTEC) 10 MG tablet Take 1 tablet (10 mg total) by mouth at bedtime.   . Cyanocobalamin (VITAMIN B12 PO) Take 1 tablet by mouth daily. 09/06/2015: Received from: [place]: Take by mouth.  . dorzolamide-timolol (COSOPT) 22.3-6.8 MG/ML ophthalmic solution USE 1 DROP(S) IN BOTH EYES 2 TIMES A DAY 09/06/2015: Received from: External Pharmacy  . Fish Oil-Cholecalciferol (FISH OIL + D3) 1000-1000 MG-UNIT CAPS Take by mouth 2 (two) times daily.   . hydrochlorothiazide (HYDRODIURIL) 25 MG tablet Take 1 tablet (25 mg total) by mouth daily.   Marland Kitchen levothyroxine (SYNTHROID, LEVOTHROID) 75 MCG tablet TAKE 1 TABLET BY  MOUTH DAILY   . Multiple Vitamins-Minerals (MULTIVITAMIN WITH MINERALS) tablet Take 1 tablet by mouth daily.   . valsartan (DIOVAN) 80 MG tablet  08/28/2014: Received from: External Pharmacy   No facility-administered encounter medications on file as of 09/24/2016.     No Known Allergies  Review of Systems  Constitutional: Positive for malaise/fatigue (possibly a result of decreased activity secondary to vision, or age.).  Eyes: Negative.   Respiratory: Negative for cough, shortness of breath and wheezing.   Cardiovascular: Negative for chest pain, palpitations, orthopnea, claudication and leg swelling.  Gastrointestinal: Negative.   Musculoskeletal: Positive for falls (couple of a months ago, bruises but no injury requiring treatment.).  Skin: Negative.   Neurological: Negative for dizziness, weakness and headaches.  Endo/Heme/Allergies: Negative.   Psychiatric/Behavioral: Negative.     Objective:  BP (!) 110/58 (BP Location: Right Arm, Patient Position: Sitting, Cuff Size: Normal)   Pulse 68   Temp 98.1 F (36.7 C) (Oral)   Resp 18   Wt 112 lb (50.8 kg)   BMI 21.16 kg/m   Physical Exam  Constitutional: She is oriented to person, place, and time and well-developed, well-nourished, and in no distress.  HENT:  Head: Normocephalic and atraumatic.  Right Ear: External ear normal.  Left Ear: External ear normal.  Nose: Nose normal.  Eyes: Conjunctivae are normal. Pupils are equal, round, and reactive to light.  Neck: Normal range of motion. Neck supple.  Cardiovascular: Normal rate, regular rhythm and normal heart sounds.   Pulmonary/Chest: Effort normal and breath sounds normal.  Abdominal: Soft.  Neurological: She is alert and oriented to person, place, and time.  Skin: Skin is warm and dry.  Psychiatric: Mood, memory, affect and judgment normal.    Assessment and Plan :   1. Osteoporosis without current pathological fracture, unspecified osteoporosis type  Patient is  instructed to discontinue her Fosamax.  Will obtain BMD and then decide on treatment.  - DG Bone Density; Future 2.HTN 3.Hypothyroid  HPI, Exam and A&P Transcribed under the direction and in the presence of Miguel Aschoff, Brooke Bonito., MD. Electronically Signed: Althea Charon, RMA I have done the exam and reviewed the chart and it is accurate to the best of my knowledge. Development worker, community has been used and  any errors in dictation or transcription are unintentional. Miguel Aschoff M.D. [facility]

## 2016-09-25 ENCOUNTER — Other Ambulatory Visit: Payer: Self-pay

## 2016-09-25 DIAGNOSIS — I10 Essential (primary) hypertension: Secondary | ICD-10-CM

## 2016-09-25 MED ORDER — HYDROCHLOROTHIAZIDE 25 MG PO TABS: 25.0000 mg | ORAL_TABLET | Freq: Every day | ORAL | 3 refills | Status: DC

## 2016-11-03 ENCOUNTER — Ambulatory Visit
Admission: RE | Admit: 2016-11-03 | Discharge: 2016-11-03 | Disposition: A | Discharge status: Home / Self Care | Payer: Medicare Other | Source: Ambulatory Visit | Attending: Family Medicine | Admitting: Family Medicine

## 2016-11-03 DIAGNOSIS — M810 Age-related osteoporosis without current pathological fracture: Secondary | ICD-10-CM | POA: Insufficient documentation

## 2016-11-03 DIAGNOSIS — M8589 Other specified disorders of bone density and structure, multiple sites: Secondary | ICD-10-CM | POA: Insufficient documentation

## 2016-12-14 ENCOUNTER — Ambulatory Visit (INDEPENDENT_AMBULATORY_CARE_PROVIDER_SITE_OTHER): Payer: Medicare Other | Admitting: Physician Assistant

## 2016-12-14 ENCOUNTER — Encounter: Payer: Self-pay | Admitting: Physician Assistant

## 2016-12-14 VITALS — BP 98/64 | HR 61 | Temp 98.0°F | Wt 107.8 lb

## 2016-12-14 DIAGNOSIS — J069 Acute upper respiratory infection, unspecified: Secondary | ICD-10-CM | POA: Diagnosis not present

## 2016-12-14 MED ORDER — AMOXICILLIN-POT CLAVULANATE 875-125 MG PO TABS: 1.0000 | ORAL_TABLET | Freq: Two times a day (BID) | ORAL | 0 refills | Status: DC

## 2016-12-14 MED ORDER — PREDNISONE 10 MG (21) PO TBPK: ORAL_TABLET | ORAL | 0 refills | Status: DC

## 2016-12-14 NOTE — Patient Instructions (Signed)
Mucinex DM for cough and congestion   Upper Respiratory Infection, Adult Most upper respiratory infections (URIs) are a viral infection of the air passages leading to the lungs. A URI affects the nose, throat, and upper air passages. The most common type of URI is nasopharyngitis and is typically referred to as "the common cold." URIs run their course and usually go away on their own. Most of the time, a URI does not require medical attention, but sometimes a bacterial infection in the upper airways can follow a viral infection. This is called a secondary infection. Sinus and middle ear infections are common types of secondary upper respiratory infections. Bacterial pneumonia can also complicate a URI. A URI can worsen asthma and chronic obstructive pulmonary disease (COPD). Sometimes, these complications can require emergency medical care and may be life threatening. What are the causes? Almost all URIs are caused by viruses. A virus is a type of germ and can spread from one person to another. What increases the risk? You may be at risk for a URI if:  You smoke.  You have chronic heart or lung disease.  You have a weakened defense (immune) system.  You are very young or very old.  You have nasal allergies or asthma.  You work in crowded or poorly ventilated areas.  You work in health care facilities or schools.  What are the signs or symptoms? Symptoms typically develop 2-3 days after you come in contact with a cold virus. Most viral URIs last 7-10 days. However, viral URIs from the influenza virus (flu virus) can last 14-18 days and are typically more severe. Symptoms may include:  Runny or stuffy (congested) nose.  Sneezing.  Cough.  Sore throat.  Headache.  Fatigue.  Fever.  Loss of appetite.  Pain in your forehead, behind your eyes, and over your cheekbones (sinus pain).  Muscle aches.  How is this diagnosed? Your health care provider may diagnose a URI  by:  Physical exam.  Tests to check that your symptoms are not due to another condition such as: ? Strep throat. ? Sinusitis. ? Pneumonia. ? Asthma.  How is this treated? A URI goes away on its own with time. It cannot be cured with medicines, but medicines may be prescribed or recommended to relieve symptoms. Medicines may help:  Reduce your fever.  Reduce your cough.  Relieve nasal congestion.  Follow these instructions at home:  Take medicines only as directed by your health care provider.  Gargle warm saltwater or take cough drops to comfort your throat as directed by your health care provider.  Use a warm mist humidifier or inhale steam from a shower to increase air moisture. This may make it easier to breathe.  Drink enough fluid to keep your urine clear or pale yellow.  Eat soups and other clear broths and maintain good nutrition.  Rest as needed.  Return to work when your temperature has returned to normal or as your health care provider advises. You may need to stay home longer to avoid infecting others. You can also use a face mask and careful hand washing to prevent spread of the virus.  Increase the usage of your inhaler if you have asthma.  Do not use any tobacco products, including cigarettes, chewing tobacco, or electronic cigarettes. If you need help quitting, ask your health care provider. How is this prevented? The best way to protect yourself from getting a cold is to practice good hygiene.  Avoid oral or hand contact  with people with cold symptoms.  Wash your hands often if contact occurs.  There is no clear evidence that vitamin C, vitamin E, echinacea, or exercise reduces the chance of developing a cold. However, it is always recommended to get plenty of rest, exercise, and practice good nutrition. Contact a health care provider if:  You are getting worse rather than better.  Your symptoms are not controlled by medicine.  You have  chills.  You have worsening shortness of breath.  You have brown or red mucus.  You have yellow or brown nasal discharge.  You have pain in your face, especially when you bend forward.  You have a fever.  You have swollen neck glands.  You have pain while swallowing.  You have white areas in the back of your throat. Get help right away if:  You have severe or persistent: ? Headache. ? Ear pain. ? Sinus pain. ? Chest pain.  You have chronic lung disease and any of the following: ? Wheezing. ? Prolonged cough. ? Coughing up blood. ? A change in your usual mucus.  You have a stiff neck.  You have changes in your: ? Vision. ? Hearing. ? Thinking. ? Mood. This information is not intended to replace advice given to you by your health care provider. Make sure you discuss any questions you have with your health care provider. Document Released: 12/16/2000 Document Revised: 02/23/2016 Document Reviewed: 09/27/2013 Elsevier Interactive Patient Education  2017 [employer].

## 2016-12-14 NOTE — Progress Notes (Signed)
Patient: Erin Ibarra Female    DOB: 22-Apr-1930   83 y.o.   MRN: [id number] Visit Date: 12/14/2016  Today's Provider: Mar Daring, PA-C   Chief Complaint  Patient presents with  . Cough  . Nasal Congestion   Subjective:    URI   This is a new problem. The current episode started 1 to 4 weeks ago. The problem has been unchanged. There has been no fever. Associated symptoms include congestion, coughing, ear pain (ear fullness), a plugged ear sensation, rhinorrhea and sneezing. Pertinent negatives include no abdominal pain, headaches, nausea, sinus pain or sore throat. Treatments tried: Mucinex. The treatment provided mild relief.      Previous Medications   ASPIRIN 81 MG TABLET    Take 81 mg by mouth daily.   BIMATOPROST (LUMIGAN) 0.03 % OPHTHALMIC SOLUTION    1 drop at bedtime.   CALCIUM CARBONATE-VIT D-MIN (CALCIUM 600 + MINERALS PO)    Take 1 tablet by mouth daily.   CARVEDILOL (COREG) 25 MG TABLET    Take 25 mg by mouth 2 (two) times daily.   CETIRIZINE (ZYRTEC) 10 MG TABLET    Take 1 tablet (10 mg total) by mouth at bedtime.   CYANOCOBALAMIN (VITAMIN B12 PO)    Take 1 tablet by mouth daily.   DORZOLAMIDE-TIMOLOL (COSOPT) 22.3-6.8 MG/ML OPHTHALMIC SOLUTION    USE 1 DROP(S) IN BOTH EYES 2 TIMES A DAY   FISH OIL-CHOLECALCIFEROL (FISH OIL + D3) 1000-1000 MG-UNIT CAPS    Take by mouth 2 (two) times daily.   HYDROCHLOROTHIAZIDE (HYDRODIURIL) 25 MG TABLET    Take 1 tablet (25 mg total) by mouth daily.   LEVOTHYROXINE (SYNTHROID, LEVOTHROID) 75 MCG TABLET    TAKE 1 TABLET BY MOUTH DAILY   MULTIPLE VITAMINS-MINERALS (MULTIVITAMIN WITH MINERALS) TABLET    Take 1 tablet by mouth daily.   VALSARTAN (DIOVAN) 80 MG TABLET        Review of Systems  Constitutional: Negative.  Negative for fatigue and fever.  HENT: Positive for congestion, ear pain (ear fullness), postnasal drip, rhinorrhea and sneezing. Negative for ear discharge, sinus pain, sinus pressure, sore throat,  tinnitus and trouble swallowing.   Respiratory: Positive for cough and shortness of breath (feels she can't take a deep breath). Negative for chest tightness.   Cardiovascular: Negative.   Gastrointestinal: Negative for abdominal pain and nausea.  Neurological: Negative for dizziness and headaches.    Social History  Substance Use Topics  . Smoking status: Former Smoker    Packs/day: 1.00    Years: 15.00    Types: Cigarettes  . Smokeless tobacco: Never Used  . Alcohol use 0.0 oz/week     Comment: 1 glass of wine qhs   Objective:   BP 98/64 (BP Location: Right Arm, Patient Position: Sitting, Cuff Size: Normal)   Pulse 61   Temp 98 F (36.7 C) (Oral)   Wt 107 lb 12.8 oz (48.9 kg)   SpO2 91%   BMI 20.37 kg/m   Physical Exam  Constitutional: She appears well-developed and well-nourished. No distress.  HENT:  Head: Normocephalic and atraumatic.  Right Ear: Hearing, tympanic membrane, external ear and ear canal normal.  Left Ear: Hearing, tympanic membrane, external ear and ear canal normal.  Nose: Mucosal edema and rhinorrhea present. Right sinus exhibits no maxillary sinus tenderness and no frontal sinus tenderness. Left sinus exhibits no maxillary sinus tenderness and no frontal sinus tenderness.  Mouth/Throat: Uvula is midline, oropharynx is clear and moist and  mucous membranes are normal. No oropharyngeal exudate, posterior oropharyngeal edema or posterior oropharyngeal erythema.  Eyes: Conjunctivae are normal. Pupils are equal, round, and reactive to light. Right eye exhibits no discharge. Left eye exhibits no discharge. No scleral icterus.  Neck: Normal range of motion. Neck supple. No tracheal deviation present. No thyromegaly present.  Cardiovascular: Normal rate, regular rhythm and normal heart sounds.  Exam reveals no gallop and no friction rub.   No murmur heard. Pulmonary/Chest: Effort normal. No stridor. No respiratory distress. She has decreased breath sounds (but no  adventitious breath sounds). She has no wheezes. She has no rhonchi. She has no rales.  Lymphadenopathy:    She has no cervical adenopathy.  Skin: Skin is warm and dry. She is not diaphoretic.  Vitals reviewed.      Assessment & Plan:     1. Upper respiratory tract infection, unspecified type Worsening symptoms that have not responded to OTC medications. Will give augmentin and prednisone as below. Continue allergy medications. May use Mucinex DM for congestion and cough. Stay well hydrated and get plenty of rest. Call if no symptom improvement or if symptoms worsen. - amoxicillin-clavulanate (AUGMENTIN) 875-125 MG tablet; Take 1 tablet by mouth 2 (two) times daily.  Dispense: 20 tablet; Refill: 0 - predniSONE (STERAPRED UNI-PAK 21 TAB) 10 MG (21) TBPK tablet; Take as directed on package instructions  Dispense: 21 tablet; Refill: 0   Follow up: No Follow-up on file.

## 2017-01-13 ENCOUNTER — Other Ambulatory Visit: Payer: Self-pay | Admitting: Family Medicine

## 2017-02-03 DIAGNOSIS — H903 Sensorineural hearing loss, bilateral: Secondary | ICD-10-CM | POA: Diagnosis not present

## 2017-02-03 DIAGNOSIS — H6121 Impacted cerumen, right ear: Secondary | ICD-10-CM | POA: Diagnosis not present

## 2017-03-02 DIAGNOSIS — H401113 Primary open-angle glaucoma, right eye, severe stage: Secondary | ICD-10-CM | POA: Diagnosis not present

## 2017-03-04 ENCOUNTER — Ambulatory Visit (INDEPENDENT_AMBULATORY_CARE_PROVIDER_SITE_OTHER): Payer: Medicare Other

## 2017-03-04 ENCOUNTER — Ambulatory Visit (INDEPENDENT_AMBULATORY_CARE_PROVIDER_SITE_OTHER): Payer: Medicare Other | Admitting: Family Medicine

## 2017-03-04 ENCOUNTER — Encounter: Payer: Self-pay | Admitting: Family Medicine

## 2017-03-04 VITALS — BP 94/52 | HR 80 | Temp 97.9°F | Ht 61.0 in | Wt 106.8 lb

## 2017-03-04 VITALS — BP 94/52 | HR 80 | Temp 97.9°F | Resp 14 | Ht 61.0 in | Wt 106.0 lb

## 2017-03-04 DIAGNOSIS — I10 Essential (primary) hypertension: Secondary | ICD-10-CM | POA: Diagnosis not present

## 2017-03-04 DIAGNOSIS — Z0000 Encounter for general adult medical examination without abnormal findings: Secondary | ICD-10-CM

## 2017-03-04 DIAGNOSIS — E039 Hypothyroidism, unspecified: Secondary | ICD-10-CM

## 2017-03-04 DIAGNOSIS — E7800 Pure hypercholesterolemia, unspecified: Secondary | ICD-10-CM

## 2017-03-04 DIAGNOSIS — Z23 Encounter for immunization: Secondary | ICD-10-CM | POA: Diagnosis not present

## 2017-03-04 NOTE — Progress Notes (Signed)
Patient: Erin Ibarra, Female    DOB: 01/26/1930, 83 y.o.   MRN: [id number] Visit Date: 03/04/2017  Today's Provider: Wilhemena Durie, MD   Chief Complaint  Patient presents with  . Annual Exam   Subjective:  Pt would like to wait to get flu vaccine later.   Annual physical exam Erin Ibarra is a 82 y.o. female who presents today for health maintenance and complete physical. She feels well. She reports exercising, walking her dog. She reports she is sleeping well.  -----------------------------------------------------------------  Colonoscopy- 05/01/04 hyperplastic polyps Pap- 06/15/07 negative BMD- 11/03/16 osteopenia Mammogram- 04/26/14 ordered last year but no record of being done.   Review of Systems  Constitutional: Negative.   HENT: Negative.   Eyes: Negative.   Respiratory: Negative.   Cardiovascular: Negative.   Gastrointestinal: Negative.   Endocrine: Negative.   Genitourinary: Negative.   Musculoskeletal: Negative.   Skin: Negative.   Allergic/Immunologic: Negative.   Neurological: Negative.   Hematological: Negative.   Psychiatric/Behavioral: Negative.     Social History      She  reports that she has quit smoking. Her smoking use included Cigarettes. She has a 15.00 pack-year smoking history. She has never used smokeless tobacco. She reports that she drinks alcohol. She reports that she does not use drugs.       Social History   Social History  . Marital status: Widowed    Spouse name: N/A  . Number of children: N/A  . Years of education: N/A   Social History Main Topics  . Smoking status: Former Smoker    Packs/day: 1.00    Years: 15.00    Types: Cigarettes  . Smokeless tobacco: Never Used  . Alcohol use 0.0 oz/week     Comment: 1 glass of wine qhs  . Drug use: No  . Sexual activity: Not Asked   Other Topics Concern  . None   Social History Narrative  . None    Past Medical History:  Diagnosis Date  .  Cancer [facility]) 1991   Bladder  . Chronic kidney disease   . Glaucoma   . Heart murmur   . Hypertension   . Thyroid disease      Patient Active Problem List   Diagnosis Date Noted  . Benign hypertensive heart and CKD, stage 3 (GFR 30-59), w CHF ([place]) 09/09/2015  . Hyperkalemia 09/09/2015  . Amblyopia 09/06/2015  . Back ache 09/06/2015  . Aortic valve, bicuspid 09/06/2015  . Bladder cancer ([place]) 09/06/2015  . Colon polyp 09/06/2015  . DD (diverticular disease) 09/06/2015  . Breathlessness on exertion 09/06/2015  . Hypercholesteremia 09/06/2015  . Below normal amount of sodium in the blood 09/06/2015  . Cervical pain 09/06/2015  . Amnesia 09/06/2015  . Post menopausal syndrome 09/06/2015  . Disorder of scalp 09/06/2015  . Hypo-osmolality and hyponatremia 09/06/2015  . Diverticulitis 09/06/2015  . Aortic regurgitation, congenital 09/06/2015  . OP (osteoporosis) 05/20/2015  . Age-related osteoporosis without current pathological fracture 05/20/2015  . Hypothyroidism 12/26/2014  . Encounter for screening colonoscopy 08/29/2014  . BP (high blood pressure) 02/03/2013  . Difficulty hearing 02/03/2013  . Cataract 10/14/2012  . Central retinal vein occlusion 10/14/2012  . 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 Cardiac failure ([place]) 06/21/2008  . Heart failure ([place]) 06/21/2008    Past Surgical History:  Procedure Laterality Date  . CATARACT EXTRACTION  2015  . COLONOSCOPY  2006  . [place]  Family History        Family Status  Relation Status  . Father Deceased at age 61  . Brother Deceased       pancreatic cancer  . Brother Deceased  . Mother Deceased at age 66       dementia  . Sister Deceased at age 68       CVA with hx of angina  . Brother Deceased  . Sister Deceased       pacemaker        Her family history includes Cancer in her brother and father; Heart disease in her brother and father; Lung cancer in her brother; Pancreatic  cancer in her brother; Prostate cancer in her father.     No Known Allergies   Current Outpatient Prescriptions:  .  aspirin 81 MG tablet, Take 81 mg by mouth daily., Disp: , Rfl:  .  bimatoprost (LUMIGAN) 0.03 % ophthalmic solution, 1 drop at bedtime., Disp: , Rfl:  .  brimonidine (ALPHAGAN) 0.2 % ophthalmic solution, , Disp: , Rfl:  .  Calcium Carbonate-Vit D-Min (CALCIUM 600 + MINERALS PO), Take 1 tablet by mouth daily., Disp: , Rfl:  .  carvedilol (COREG) 25 MG tablet, Take 12.5 mg by mouth 2 (two) times daily. , Disp: , Rfl: 7 .  dexamethasone (DECADRON) 0.5 MG/5ML solution, , Disp: , Rfl:  .  dorzolamide-timolol (COSOPT) 22.3-6.8 MG/ML ophthalmic solution, USE 1 DROP(S) IN BOTH EYES 2 TIMES A DAY, Disp: , Rfl: 5 .  Fish Oil-Cholecalciferol (FISH OIL + D3) 1000-1000 MG-UNIT CAPS, Take by mouth 2 (two) times daily., Disp: , Rfl:  .  hydrochlorothiazide (HYDRODIURIL) 25 MG tablet, Take 1 tablet (25 mg total) by mouth daily., Disp: 90 tablet, Rfl: 3 .  levothyroxine (SYNTHROID, LEVOTHROID) 75 MCG tablet, TAKE 1 TABLET BY MOUTH DAILY, Disp: 90 tablet, Rfl: 0 .  Multiple Vitamins-Minerals (MULTIVITAMIN WITH MINERALS) tablet, Take 1 tablet by mouth daily., Disp: , Rfl:  .  valsartan (DIOVAN) 80 MG tablet, Take 80 mg by mouth daily. , Disp: , Rfl: 4   Patient Care Team: Jerrol Banana., MD as PCP - General (Family Medicine) Murlean Iba, MD as Consulting Physician (Internal Medicine) Leandrew Koyanagi, MD as Referring Physician (Ophthalmology) Isaias Cowman, MD as Consulting Physician (Cardiology) Kipp Laurence, MD as Referring Physician (Audiology)      Objective:   Vitals: BP (!) 94/52   Pulse 80   Temp 97.9 F (36.6 C) (Oral)   Resp 14   Ht 5\' 1"  (1.549 m)   Wt 106 lb (48.1 kg)   BMI 20.03 kg/m    Vitals:   03/04/17 0917  BP: (!) 94/52  Pulse: 80  Resp: 14  Temp: 97.9 F (36.6 C)  TempSrc: Oral  Weight: 106 lb (48.1 kg)  Height: 5\' 1"  (1.549 m)      Physical Exam  Constitutional: She is oriented to person, place, and time. She appears well-developed and well-nourished.  HENT:  Head: Normocephalic and atraumatic.  Right Ear: External ear normal.  Left Ear: External ear normal.  Nose: Nose normal.  Mouth/Throat: Oropharynx is clear and moist.  Eyes: Pupils are equal, round, and reactive to light. Conjunctivae and EOM are normal.  Neck: Normal range of motion. Neck supple.  Cardiovascular: Normal rate, regular rhythm, normal heart sounds and intact distal pulses.   Pulmonary/Chest: Effort normal and breath sounds normal.  Abdominal: Soft. Bowel sounds are normal.  Musculoskeletal: Normal range of motion.  Mild scoliosis.  Neurological: She is alert and oriented to person, place, and time. She has normal reflexes.  Skin: Skin is warm and dry.  Psychiatric: She has a normal mood and affect. Her behavior is normal. Judgment and thought content normal.     Depression Screen PHQ 2/9 Scores 03/04/2017 03/04/2017 01/29/2016  PHQ - 2 Score 1 1 0  PHQ- 9 Score 3 - -      Assessment & Plan:     Routine Health Maintenance and Physical Exam  1. Annual physical exam   2. Need for influenza vaccination  - Flu vaccine HIGH DOSE PF  3. Essential hypertension  - CBC with Differential/Platelet - TSH  4. Hypothyroidism, unspecified type   5. Hypercholesteremia  - Lipid Panel With LDL/HDL Ratio - Comprehensive metabolic panel 6.Glaucoma   HPI, Exam, and A&P Transcribed under the direction and in the presence of Rain Friedt L. Cranford Mon, MD  Electronically Signed: Katina Dung, CMA  Exercise Activities and Dietary recommendations Goals    . Increase water intake          Recommend increasing water intake to 4-6 glasses a day.        Immunization History  Administered Date(s) Administered  . H1N1 07/02/2008  . Influenza Split 07/19/2009  . Influenza, High Dose Seasonal PF 04/06/2014, 04/06/2015  .  Influenza,inj,Quad PF,6+ Mos 03/29/2013  . Pneumococcal Conjugate-13 04/06/2014  . Pneumococcal Polysaccharide-23 04/30/1997  . Td 03/06/2004, 08/04/2013  . Tdap 08/04/2013  . Zoster 09/20/2008    Health Maintenance  Topic Date Due  . INFLUENZA VACCINE  02/03/2017  . TETANUS/TDAP  08/05/2023  . DEXA SCAN  Completed  . PNA vac Low Risk Adult  Completed     Discussed health benefits of physical activity, and encouraged her to engage in regular exercise appropriate for her age and condition.    --------------------------------------------------------------------   I have done the exam and reviewed the above chart and it is accurate to the best of my knowledge. Development worker, community has been used in this note in any air is in the dictation or transcription are unintentional.  Wilhemena Durie, MD  [place]

## 2017-03-04 NOTE — Progress Notes (Signed)
Subjective:   Erin Ibarra is a 83 y.o. female who presents for Medicare Annual (Subsequent) preventive examination.  Review of Systems:  N/A  Cardiac Risk Factors include: advanced age (>83men, >37 women);dyslipidemia     Objective:     Vitals: BP (!) 94/52 (BP Location: Left Arm)   Pulse 80   Temp 97.9 F (36.6 C) (Oral)   Ht 5\' 1"  (1.549 m)   Wt 106 lb 12.8 oz (48.4 kg)   BMI 20.18 kg/m   Body mass index is 20.18 kg/m.   Tobacco History  Smoking Status  . Former Smoker  . Packs/day: 1.00  . Years: 15.00  . Types: Cigarettes  Smokeless Tobacco  . Never Used     Counseling given: Not Answered   Past Medical History:  Diagnosis Date  . Cancer [facility]) 1991   Bladder  . Chronic kidney disease   . Glaucoma   . Heart murmur   . Hypertension   . Thyroid disease    Past Surgical History:  Procedure Laterality Date  . CATARACT EXTRACTION  2015  . COLONOSCOPY  2006  . TRANSURETHRAL RESECTION OF BLADDER  1991   Family History  Problem Relation Age of Onset  . Prostate cancer Father   . Cancer Father        prostate  . Heart disease Father        MI  . Pancreatic cancer Brother   . Lung cancer Brother   . Cancer Brother        brain, lung  . Heart disease Brother        MI   History  Sexual Activity  . Sexual activity: Not on file    Outpatient Encounter Prescriptions as of 03/04/2017  Medication Sig  . aspirin 81 MG tablet Take 81 mg by mouth daily.  . Calcium Carbonate-Vit D-Min (CALCIUM 600 + MINERALS PO) Take 1 tablet by mouth daily.  . carvedilol (COREG) 25 MG tablet Take 12.5 mg by mouth 2 (two) times daily.   . dorzolamide-timolol (COSOPT) 22.3-6.8 MG/ML ophthalmic solution USE 1 DROP(S) IN BOTH EYES 2 TIMES A DAY  . Fish Oil-Cholecalciferol (FISH OIL + D3) 1000-1000 MG-UNIT CAPS Take by mouth 2 (two) times daily.  . hydrochlorothiazide (HYDRODIURIL) 25 MG tablet Take 1 tablet (25 mg total) by mouth daily.  Marland Kitchen levothyroxine  (SYNTHROID, LEVOTHROID) 75 MCG tablet TAKE 1 TABLET BY MOUTH DAILY  . Multiple Vitamins-Minerals (MULTIVITAMIN WITH MINERALS) tablet Take 1 tablet by mouth daily.  . valsartan (DIOVAN) 80 MG tablet Take 80 mg by mouth daily.   . bimatoprost (LUMIGAN) 0.03 % ophthalmic solution 1 drop at bedtime.  . brimonidine (ALPHAGAN) 0.2 % ophthalmic solution   . dexamethasone (DECADRON) 0.5 MG/5ML solution   . [DISCONTINUED] amoxicillin-clavulanate (AUGMENTIN) 875-125 MG tablet Take 1 tablet by mouth 2 (two) times daily.  . [DISCONTINUED] cetirizine (ZYRTEC) 10 MG tablet Take 1 tablet (10 mg total) by mouth at bedtime.  . [DISCONTINUED] Cyanocobalamin (VITAMIN B12 PO) Take 1 tablet by mouth daily.  . [DISCONTINUED] predniSONE (STERAPRED UNI-PAK 21 TAB) 10 MG (21) TBPK tablet Take as directed on package instructions   No facility-administered encounter medications on file as of 03/04/2017.     Activities of Daily Living In your present state of health, do you have any difficulty performing the following activities: 03/04/2017  Hearing? Y  Vision? Y  Difficulty concentrating or making decisions? Y  Walking or climbing stairs? Y  Dressing or bathing? N  Doing errands, shopping? N  Preparing Food and eating ? N  Using the Toilet? N  In the past six months, have you accidently leaked urine? Y  Comment wears protection  Do you have problems with loss of bowel control? N  Managing your Medications? N  Managing your Finances? N  Housekeeping or managing your Housekeeping? N  Some recent data might be hidden    Patient Care Team: Jerrol Banana., MD as PCP - General (Family Medicine) Murlean Iba, MD as Consulting Physician (Internal Medicine) Leandrew Koyanagi, MD as Referring Physician (Ophthalmology) Isaias Cowman, MD as Consulting Physician (Cardiology) Kipp Laurence, MD as Referring Physician (Audiology)    Assessment:     Exercise Activities and Dietary  recommendations Current Exercise Habits: The patient does not participate in regular exercise at present (walks dog daily), Exercise limited by: None identified  Goals    . Increase water intake          Recommend increasing water intake to 4-6 glasses a day.       Fall Risk Fall Risk  03/04/2017 01/29/2016  Falls in the past year? Yes Yes  Number falls in past yr: 2 or more 1  Injury with Fall? No No  Comment bruising -  Follow up Falls prevention discussed -   Depression Screen PHQ 2/9 Scores 03/04/2017 03/04/2017 01/29/2016  PHQ - 2 Score 1 1 0  PHQ- 9 Score 3 - -     Cognitive Function     6CIT Screen 03/04/2017  What Year? 0 points  What month? 0 points  What time? 0 points  Count back from 20 0 points  Months in reverse 0 points  Repeat phrase 8 points  Total Score 8    Immunization History  Administered Date(s) Administered  . H1N1 07/02/2008  . Influenza Split 07/19/2009  . Influenza, High Dose Seasonal PF 04/06/2014, 04/06/2015  . Influenza,inj,Quad PF,6+ Mos 03/29/2013  . Pneumococcal Conjugate-13 04/06/2014  . Pneumococcal Polysaccharide-23 04/30/1997  . Td 03/06/2004, 08/04/2013  . Tdap 08/04/2013  . Zoster 09/20/2008   Screening Tests Health Maintenance  Topic Date Due  . INFLUENZA VACCINE  02/03/2017  . TETANUS/TDAP  08/05/2023  . DEXA SCAN  Completed  . PNA vac Low Risk Adult  Completed      Plan:  I have personally reviewed and addressed the Medicare Annual Wellness questionnaire and have noted the following in the patient's chart:  A. Medical and social history B. Use of alcohol, tobacco or illicit drugs  C. Current medications and supplements D. Functional ability and status E.  Nutritional status F.  Physical activity G. Advance directives H. List of other physicians I.  Hospitalizations, surgeries, and ER visits in previous 12 months J.  [place] such as hearing and vision if needed, cognitive and depression L. Referrals  and appointments - none  In addition, I have reviewed and discussed with patient certain preventive protocols, quality metrics, and best practice recommendations. A written personalized care plan for preventive services as well as general preventive health recommendations were provided to patient.  See attached scanned questionnaire for additional information.   Signed,  Fabio Neighbors, LPN Nurse Health Advisor   MD Recommendations: Need influenza vaccine- pt would like to PCP first.

## 2017-03-04 NOTE — Patient Instructions (Signed)
Erin Ibarra , Thank you for taking time to come for your Medicare Wellness Visit. I appreciate your ongoing commitment to your health goals. Please review the following plan we discussed and let me know if I can assist you in the future.   Screening recommendations/referrals: Colonoscopy: completed, no longer required Mammogram: up to date, no longer required Bone Density: completed Recommended yearly ophthalmology/optometry visit for glaucoma screening and checkup Recommended yearly dental visit for hygiene and checkup  Vaccinations: Influenza vaccine: declined- would like to speak with PCP first Pneumococcal vaccine: completed series Tdap vaccine: up to date, due 07/2023 Shingles vaccine: completed  Advanced directives: Please bring a copy of your POA (Power of [place]) and/or Living Will to your next appointment.   Conditions/risks identified: Fall risk prevention; Recommend increasing water intake to 4-6 glasses a day.   Next appointment: None, need to schedule 1 year AWV.   Preventive Care 21 Years and Older, Female Preventive care refers to lifestyle choices and visits with your health care provider that can promote health and wellness. What does preventive care include?  A yearly physical exam. This is also called an annual well check.  Dental exams once or twice a year.  Routine eye exams. Ask your health care provider how often you should have your eyes checked.  Personal lifestyle choices, including:  Daily care of your teeth and gums.  Regular physical activity.  Eating a healthy diet.  Avoiding tobacco and drug use.  Limiting alcohol use.  Practicing safe sex.  Taking low-dose aspirin every day.  Taking vitamin and mineral supplements as recommended by your health care provider. What happens during an annual well check? The services and screenings done by your health care provider during your annual well check will depend on your age, overall health,  lifestyle risk factors, and family history of disease. Counseling  Your health care provider may ask you questions about your:  Alcohol use.  Tobacco use.  Drug use.  Emotional well-being.  Home and relationship well-being.  Sexual activity.  Eating habits.  History of falls.  Memory and ability to understand (cognition).  Work and work Statistician.  Reproductive health. Screening  You may have the following tests or measurements:  Height, weight, and BMI.  Blood pressure.  Lipid and cholesterol levels. These may be checked every 5 years, or more frequently if you are over 92 years old.  Skin check.  Lung cancer screening. You may have this screening every year starting at age 91 if you have a 30-pack-year history of smoking and currently smoke or have quit within the past 15 years.  Fecal occult blood test (FOBT) of the stool. You may have this test every year starting at age 43.  Flexible sigmoidoscopy or colonoscopy. You may have a sigmoidoscopy every 5 years or a colonoscopy every 10 years starting at age 25.  Hepatitis C blood test.  Hepatitis B blood test.  Sexually transmitted disease (STD) testing.  Diabetes screening. This is done by checking your blood sugar (glucose) after you have not eaten for a while (fasting). You may have this done every 1-3 years.  Bone density scan. This is done to screen for osteoporosis. You may have this done starting at age 94.  Mammogram. This may be done every 1-2 years. Talk to your health care provider about how often you should have regular mammograms. Talk with your health care provider about your test results, treatment options, and if necessary, the need for more tests. Vaccines  Your health care provider may recommend certain vaccines, such as:  Influenza vaccine. This is recommended every year.  Tetanus, diphtheria, and acellular pertussis (Tdap, Td) vaccine. You may need a Td booster every 10 years.  Zoster  vaccine. You may need this after age 66.  Pneumococcal 13-valent conjugate (PCV13) vaccine. One dose is recommended after age 21.  Pneumococcal polysaccharide (PPSV23) vaccine. One dose is recommended after age 70. Talk to your health care provider about which screenings and vaccines you need and how often you need them. This information is not intended to replace advice given to you by your health care provider. Make sure you discuss any questions you have with your health care provider. Document Released: 07/19/2015 Document Revised: 03/11/2016 Document Reviewed: 04/23/2015 Elsevier Interactive Patient Education  2017 [place] Prevention in the Home Falls can cause injuries. They can happen to people of all ages. There are many things you can do to make your home safe and to help prevent falls. What can I do on the outside of my home?  Regularly fix the edges of walkways and driveways and fix any cracks.  Remove anything that might make you trip as you walk through a door, such as a raised step or threshold.  Trim any bushes or trees on the path to your home.  Use bright outdoor lighting.  Clear any walking paths of anything that might make someone trip, such as rocks or tools.  Regularly check to see if handrails are loose or broken. Make sure that both sides of any steps have handrails.  Any raised decks and porches should have guardrails on the edges.  Have any leaves, snow, or ice cleared regularly.  Use sand or salt on walking paths during winter.  Clean up any spills in your garage right away. This includes oil or grease spills. What can I do in the bathroom?  Use night lights.  Install grab bars by the toilet and in the tub and shower. Do not use towel bars as grab bars.  Use non-skid mats or decals in the tub or shower.  If you need to sit down in the shower, use a plastic, non-slip stool.  Keep the floor dry. Clean up any water that spills on the  floor as soon as it happens.  Remove soap buildup in the tub or shower regularly.  Attach bath mats securely with double-sided non-slip rug tape.  Do not have throw rugs and other things on the floor that can make you trip. What can I do in the bedroom?  Use night lights.  Make sure that you have a light by your bed that is easy to reach.  Do not use any sheets or blankets that are too big for your bed. They should not hang down onto the floor.  Have a firm chair that has side arms. You can use this for support while you get dressed.  Do not have throw rugs and other things on the floor that can make you trip. What can I do in the kitchen?  Clean up any spills right away.  Avoid walking on wet floors.  Keep items that you use a lot in easy-to-reach places.  If you need to reach something above you, use a strong step stool that has a grab bar.  Keep electrical cords out of the way.  Do not use floor polish or wax that makes floors slippery. If you must use wax, use non-skid floor wax.  Do  not have throw rugs and other things on the floor that can make you trip. What can I do with my stairs?  Do not leave any items on the stairs.  Make sure that there are handrails on both sides of the stairs and use them. Fix handrails that are broken or loose. Make sure that handrails are as long as the stairways.  Check any carpeting to make sure that it is firmly attached to the stairs. Fix any carpet that is loose or worn.  Avoid having throw rugs at the top or bottom of the stairs. If you do have throw rugs, attach them to the floor with carpet tape.  Make sure that you have a light switch at the top of the stairs and the bottom of the stairs. If you do not have them, ask someone to add them for you. What else can I do to help prevent falls?  Wear shoes that:  Do not have high heels.  Have rubber bottoms.  Are comfortable and fit you well.  Are closed at the toe. Do not wear  sandals.  If you use a stepladder:  Make sure that it is fully opened. Do not climb a closed stepladder.  Make sure that both sides of the stepladder are locked into place.  Ask someone to hold it for you, if possible.  Clearly mark and make sure that you can see:  Any grab bars or handrails.  First and last steps.  Where the edge of each step is.  Use tools that help you move around (mobility aids) if they are needed. These include:  Canes.  Walkers.  Scooters.  Crutches.  Turn on the lights when you go into a dark area. Replace any light bulbs as soon as they burn out.  Set up your furniture so you have a clear path. Avoid moving your furniture around.  If any of your floors are uneven, fix them.  If there are any pets around you, be aware of where they are.  Review your medicines with your doctor. Some medicines can make you feel dizzy. This can increase your chance of falling. Ask your doctor what other things that you can do to help prevent falls. This information is not intended to replace advice given to you by your health care provider. Make sure you discuss any questions you have with your health care provider. Document Released: 04/18/2009 Document Revised: 11/28/2015 Document Reviewed: 07/27/2014 Elsevier Interactive Patient Education  2017 [employer].

## 2017-03-16 DIAGNOSIS — I10 Essential (primary) hypertension: Secondary | ICD-10-CM | POA: Diagnosis not present

## 2017-03-16 DIAGNOSIS — E7800 Pure hypercholesterolemia, unspecified: Secondary | ICD-10-CM | POA: Diagnosis not present

## 2017-03-17 LAB — CBC WITH DIFFERENTIAL/PLATELET
Basophils Absolute: 0.1 10*3/uL (ref 0.0–0.2)
Basos: 1 %
EOS (ABSOLUTE): 0.2 10*3/uL (ref 0.0–0.4)
EOS: 2 %
HEMATOCRIT: 39.5 % (ref 34.0–46.6)
HEMOGLOBIN: 12.7 g/dL (ref 11.1–15.9)
IMMATURE GRANULOCYTES: 0 %
Immature Grans (Abs): 0 10*3/uL (ref 0.0–0.1)
LYMPHS ABS: 2.5 10*3/uL (ref 0.7–3.1)
Lymphs: 28 %
MCH: 31.3 pg (ref 26.6–33.0)
MCHC: 32.2 g/dL (ref 31.5–35.7)
MCV: 97 fL (ref 79–97)
MONOCYTES: 8 %
Monocytes Absolute: 0.7 10*3/uL (ref 0.1–0.9)
NEUTROS PCT: 61 %
Neutrophils Absolute: 5.5 10*3/uL (ref 1.4–7.0)
Platelets: 340 10*3/uL (ref 150–379)
RBC: 4.06 x10E6/uL (ref 3.77–5.28)
RDW: 13 % (ref 12.3–15.4)
WBC: 9 10*3/uL (ref 3.4–10.8)

## 2017-03-17 LAB — COMPREHENSIVE METABOLIC PANEL
A/G RATIO: 1.7 (ref 1.2–2.2)
ALK PHOS: 58 IU/L (ref 39–117)
ALT: 10 IU/L (ref 0–32)
AST: 17 IU/L (ref 0–40)
Albumin: 4.3 g/dL (ref 3.5–4.7)
BUN/Creatinine Ratio: 31 — ABNORMAL HIGH (ref 12–28)
BUN: 42 mg/dL — ABNORMAL HIGH (ref 8–27)
Bilirubin Total: 0.4 mg/dL (ref 0.0–1.2)
CO2: 22 mmol/L (ref 20–29)
Calcium: 9.7 mg/dL (ref 8.7–10.3)
Chloride: 95 mmol/L — ABNORMAL LOW (ref 96–106)
Creatinine, Ser: 1.36 mg/dL — ABNORMAL HIGH (ref 0.57–1.00)
GFR calc Af Amer: 41 mL/min/{1.73_m2} — ABNORMAL LOW (ref >59)
GFR calc non Af Amer: 36 mL/min/{1.73_m2} — ABNORMAL LOW (ref >59)
GLOBULIN, TOTAL: 2.6 g/dL (ref 1.5–4.5)
Glucose: 97 mg/dL (ref 65–99)
Potassium: 4.8 mmol/L (ref 3.5–5.2)
SODIUM: 137 mmol/L (ref 134–144)
Total Protein: 6.9 g/dL (ref 6.0–8.5)

## 2017-03-17 LAB — LIPID PANEL WITH LDL/HDL RATIO
Cholesterol, Total: 186 mg/dL (ref 100–199)
HDL: 75 mg/dL (ref >39)
LDL CALC: 99 mg/dL (ref 0–99)
LDL/HDL RATIO: 1.3 ratio (ref 0.0–3.2)
TRIGLYCERIDES: 58 mg/dL (ref 0–149)
VLDL Cholesterol Cal: 12 mg/dL (ref 5–40)

## 2017-03-17 LAB — TSH: TSH: 1.87 u[IU]/mL (ref 0.450–4.500)

## 2017-03-25 DIAGNOSIS — E875 Hyperkalemia: Secondary | ICD-10-CM | POA: Diagnosis not present

## 2017-03-25 DIAGNOSIS — I10 Essential (primary) hypertension: Secondary | ICD-10-CM | POA: Diagnosis not present

## 2017-03-25 DIAGNOSIS — N261 Atrophy of kidney (terminal): Secondary | ICD-10-CM | POA: Diagnosis not present

## 2017-03-25 DIAGNOSIS — N183 Chronic kidney disease, stage 3 (moderate): Secondary | ICD-10-CM | POA: Diagnosis not present

## 2017-04-11 ENCOUNTER — Other Ambulatory Visit: Payer: Self-pay | Admitting: Family Medicine

## 2017-05-13 DIAGNOSIS — M270 Developmental disorders of jaws: Secondary | ICD-10-CM | POA: Diagnosis not present

## 2017-06-02 DIAGNOSIS — Q231 Congenital insufficiency of aortic valve: Secondary | ICD-10-CM | POA: Diagnosis not present

## 2017-06-02 DIAGNOSIS — I10 Essential (primary) hypertension: Secondary | ICD-10-CM | POA: Diagnosis not present

## 2017-06-02 DIAGNOSIS — E7800 Pure hypercholesterolemia, unspecified: Secondary | ICD-10-CM | POA: Diagnosis not present

## 2017-06-02 DIAGNOSIS — I519 Heart disease, unspecified: Secondary | ICD-10-CM | POA: Diagnosis not present

## 2017-06-02 DIAGNOSIS — R0609 Other forms of dyspnea: Secondary | ICD-10-CM | POA: Diagnosis not present

## 2017-06-10 ENCOUNTER — Ambulatory Visit
Admission: RE | Admit: 2017-06-10 | Discharge: 2017-06-10 | Disposition: A | Discharge status: Home / Self Care | Payer: Medicare Other | Source: Ambulatory Visit | Attending: Family Medicine | Admitting: Family Medicine

## 2017-06-10 ENCOUNTER — Ambulatory Visit (INDEPENDENT_AMBULATORY_CARE_PROVIDER_SITE_OTHER): Payer: Medicare Other | Admitting: Family Medicine

## 2017-06-10 VITALS — BP 148/92 | HR 85 | Temp 98.2°F | Resp 16 | Wt 105.0 lb

## 2017-06-10 DIAGNOSIS — I5022 Chronic systolic (congestive) heart failure: Secondary | ICD-10-CM | POA: Diagnosis not present

## 2017-06-10 DIAGNOSIS — R058 Other specified cough: Secondary | ICD-10-CM

## 2017-06-10 DIAGNOSIS — R05 Cough: Secondary | ICD-10-CM | POA: Insufficient documentation

## 2017-06-10 DIAGNOSIS — J449 Chronic obstructive pulmonary disease, unspecified: Secondary | ICD-10-CM | POA: Insufficient documentation

## 2017-06-10 NOTE — Progress Notes (Signed)
Patient: Erin Ibarra Female    DOB: [birth year]   83 y.o.   MRN: [id number] Visit Date: 06/10/2017  Today's Provider: Lavon Paganini, MD   Chief Complaint  Patient presents with  . URI   Subjective:    URI   This is a new problem. Episode onset: x 2 weeks. The problem has been unchanged. There has been no fever. Associated symptoms include congestion, coughing (productive), nausea, a plugged ear sensation, a rash, sneezing and wheezing. Pertinent negatives include no abdominal pain, chest pain, ear pain, headaches, rhinorrhea, sinus pain, sore throat, swollen glands or vomiting. Treatments tried: Mucinex, Robitussin. The treatment provided no relief.   Not sure what sputum looks like as she is swallowing it back down.  States it has been a long time since she had bronchitis but things this is similar.   Does have h/o glaucoma, so cannot take steroids.  Chart review reveals that patient has h/o HFrEF - last EF 45% on Echo.  She is not taking loop diuretic.  Followed by Cardiology.  She also has CKD 3 followed by Nephrology. And stable chronic mild exertional dyspnea.    No Known Allergies   Current Outpatient Medications:  .  aspirin 81 MG tablet, Take 81 mg by mouth daily., Disp: , Rfl:  .  brimonidine (ALPHAGAN) 0.2 % ophthalmic solution, , Disp: , Rfl:  .  Calcium Carbonate-Vit D-Min (CALCIUM 600 + MINERALS PO), Take 1 tablet by mouth daily., Disp: , Rfl:  .  carvedilol (COREG) 25 MG tablet, Take 12.5 mg by mouth 2 (two) times daily. , Disp: , Rfl: 7 .  dorzolamide-timolol (COSOPT) 22.3-6.8 MG/ML ophthalmic solution, USE 1 DROP(S) IN BOTH EYES 2 TIMES A DAY, Disp: , Rfl: 5 .  Fish Oil-Cholecalciferol (FISH OIL + D3) 1000-1000 MG-UNIT CAPS, Take by mouth 2 (two) times daily., Disp: , Rfl:  .  hydrochlorothiazide (HYDRODIURIL) 25 MG tablet, Take 1 tablet (25 mg total) by mouth daily., Disp: 90 tablet, Rfl: 3 .  levothyroxine (SYNTHROID, LEVOTHROID) 75 MCG tablet,  TAKE 1 TABLET BY MOUTH EVERY DAY. NEEDS AN OV., Disp: 90 tablet, Rfl: 3 .  Multiple Vitamins-Minerals (MULTIVITAMIN WITH MINERALS) tablet, Take 1 tablet by mouth daily., Disp: , Rfl:  .  valsartan (DIOVAN) 80 MG tablet, Take 80 mg by mouth daily. , Disp: , Rfl: 4 .  VYZULTA 0.024 % SOLN, INSTILL 1 DROP INTO RIGHT EYE IN THE EVENING, Disp: , Rfl: 6  Review of Systems  HENT: Positive for congestion and sneezing. Negative for ear pain, rhinorrhea, sinus pain and sore throat.   Respiratory: Positive for cough (productive) and wheezing.   Cardiovascular: Negative for chest pain.  Gastrointestinal: Positive for nausea. Negative for abdominal pain and vomiting.  Skin: Positive for rash.  Neurological: Negative for headaches.    Social History   Tobacco Use  . Smoking status: Former Smoker    Packs/day: 1.00    Years: 15.00    Pack years: 15.00    Types: Cigarettes  . Smokeless tobacco: Never Used  Substance Use Topics  . Alcohol use: Yes    Alcohol/week: 0.0 oz    Comment: 1 glass of wine qhs   Objective:   BP (!) 148/92 (BP Location: Left Arm, Patient Position: Sitting, Cuff Size: Normal)   Pulse 85   Temp 98.2 F (36.8 C) (Oral)   Resp 16   Wt 105 lb (47.6 kg)   SpO2 95%   BMI 19.84 kg/m  Vitals:   06/10/17 1613  BP: (!) 148/92  Pulse: 85  Resp: 16  Temp: 98.2 F (36.8 C)  TempSrc: Oral  SpO2: 95%  Weight: 105 lb (47.6 kg)     Physical Exam  Constitutional: She is oriented to person, place, and time. She appears well-developed and well-nourished. No distress.  Intermittently coughing (wet)  HENT:  Head: Normocephalic and atraumatic.  Right Ear: Tympanic membrane, external ear and ear canal normal.  Left Ear: Tympanic membrane, external ear and ear canal normal.  Nose: Nose normal. Right sinus exhibits no maxillary sinus tenderness and no frontal sinus tenderness. Left sinus exhibits no maxillary sinus tenderness and no frontal sinus tenderness.  Mouth/Throat:  Uvula is midline, oropharynx is clear and moist and mucous membranes are normal.  Eyes: Conjunctivae are normal. Pupils are equal, round, and reactive to light. No scleral icterus.  Neck: Neck supple. No thyromegaly present.  Cardiovascular: Normal rate, regular rhythm, normal heart sounds and intact distal pulses.  No murmur heard. Pulmonary/Chest: Effort normal. No respiratory distress.  Diffuse rhonchi, decreased breath sounds in R base  Musculoskeletal: She exhibits no edema or deformity.  Lymphadenopathy:    She has no cervical adenopathy.  Neurological: She is alert and oriented to person, place, and time.  Psychiatric: She has a normal mood and affect. Her behavior is normal.  Vitals reviewed.       Assessment & Plan:     1. Productive cough - given focal lung exam, concerning for pneumonia, though could also be viral process vs heart failure exacerbation - will obtain CXR and determine whether antibiotics are indicated from results - DG Chest 2 View; Future  2. Chronic systolic heart failure ([place]) - patient followed by [facility] Cardiology - not currently on loop diuretic - if CXR shows extensive vascular congestion, consider trial of Lasix and plan to recheck Cr if this is trialed   Return if symptoms worsen or fail to improve.     The entirety of the information documented in the History of Present Illness, Review of Systems and Physical Exam were personally obtained by me. Portions of this information were initially documented by Raquel Sarna Ratchford, CMA and reviewed by me for thoroughness and accuracy.     Lavon Paganini, MD  [facility]

## 2017-06-10 NOTE — Patient Instructions (Signed)
Cough, Adult Coughing is a reflex that clears your throat and your airways. Coughing helps to heal and protect your lungs. It is normal to cough occasionally, but a cough that happens with other symptoms or lasts a long time may be a sign of a condition that needs treatment. A cough may last only 2-3 weeks (acute), or it may last longer than 8 weeks (chronic). What are the causes? Coughing is commonly caused by:  Breathing in substances that irritate your lungs.  A viral or bacterial respiratory infection.  Allergies.  Asthma.  Postnasal drip.  Smoking.  Acid backing up from the stomach into the esophagus (gastroesophageal reflux).  Certain medicines.  Chronic lung problems, including COPD (or rarely, lung cancer).  Other medical conditions such as heart failure.  Follow these instructions at home: Pay attention to any changes in your symptoms. Take these actions to help with your discomfort:  Take medicines only as told by your health care provider. ? If you were prescribed an antibiotic medicine, take it as told by your health care provider. Do not stop taking the antibiotic even if you start to feel better. ? Talk with your health care provider before you take a cough suppressant medicine.  Drink enough fluid to keep your urine clear or pale yellow.  If the air is dry, use a cold steam vaporizer or humidifier in your bedroom or your home to help loosen secretions.  Avoid anything that causes you to cough at work or at home.  If your cough is worse at night, try sleeping in a semi-upright position.  Avoid cigarette smoke. If you smoke, quit smoking. If you need help quitting, ask your health care provider.  Avoid caffeine.  Avoid alcohol.  Rest as needed.  Contact a health care provider if:  You have new symptoms.  You cough up pus.  Your cough does not get better after 2-3 weeks, or your cough gets worse.  You cannot control your cough with suppressant  medicines and you are losing sleep.  You develop pain that is getting worse or pain that is not controlled with pain medicines.  You have a fever.  You have unexplained weight loss.  You have night sweats. Get help right away if:  You cough up blood.  You have difficulty breathing.  Your heartbeat is very fast. This information is not intended to replace advice given to you by your health care provider. Make sure you discuss any questions you have with your health care provider. Document Released: 12/19/2010 Document Revised: 11/28/2015 Document Reviewed: 08/29/2014 Elsevier Interactive Patient Education  2017 Elsevier Inc.  

## 2017-06-11 ENCOUNTER — Other Ambulatory Visit: Payer: Self-pay | Admitting: Family Medicine

## 2017-06-11 ENCOUNTER — Telehealth: Payer: Self-pay

## 2017-06-11 MED ORDER — AZITHROMYCIN 250 MG PO TABS: ORAL_TABLET | ORAL | 0 refills | Status: DC

## 2017-06-11 NOTE — Telephone Encounter (Signed)
Pt advised and agrees with treatment plan. 

## 2017-06-11 NOTE — Telephone Encounter (Signed)
-----   Message from Virginia Crews, MD sent at 06/11/2017 11:14 AM EST ----- No clear pneumonia on chest XRay, but there are signs of COPD.  This cough could represent a COPD exacerbation, which can be treated with Azithromycin.  I have sent a 5 day course to the pharmacy.  Normally, we would also treat with streroids, but considering your glaucoma, we will hold off on these for now.  Be sure to come back if not getting better.  Virginia Crews, MD, MPH [facility] 06/11/2017 11:14 AM

## 2017-07-02 DIAGNOSIS — H401112 Primary open-angle glaucoma, right eye, moderate stage: Secondary | ICD-10-CM | POA: Diagnosis not present

## 2017-07-13 ENCOUNTER — Emergency Department: Payer: Medicare Other

## 2017-07-13 ENCOUNTER — Encounter: Payer: Self-pay | Admitting: Emergency Medicine

## 2017-07-13 ENCOUNTER — Emergency Department
Admission: EM | Admit: 2017-07-13 | Discharge: 2017-07-13 | Disposition: A | Discharge status: Home / Self Care | Payer: Medicare Other | Attending: Emergency Medicine | Admitting: Emergency Medicine

## 2017-07-13 DIAGNOSIS — I130 Hypertensive heart and chronic kidney disease with heart failure and stage 1 through stage 4 chronic kidney disease, or unspecified chronic kidney disease: Secondary | ICD-10-CM | POA: Diagnosis not present

## 2017-07-13 DIAGNOSIS — Y9301 Activity, walking, marching and hiking: Secondary | ICD-10-CM | POA: Insufficient documentation

## 2017-07-13 DIAGNOSIS — S0990XA Unspecified injury of head, initial encounter: Secondary | ICD-10-CM | POA: Diagnosis not present

## 2017-07-13 DIAGNOSIS — E039 Hypothyroidism, unspecified: Secondary | ICD-10-CM | POA: Diagnosis not present

## 2017-07-13 DIAGNOSIS — S0093XA Contusion of unspecified part of head, initial encounter: Secondary | ICD-10-CM | POA: Insufficient documentation

## 2017-07-13 DIAGNOSIS — Y929 Unspecified place or not applicable: Secondary | ICD-10-CM | POA: Insufficient documentation

## 2017-07-13 DIAGNOSIS — S82045A Nondisplaced comminuted fracture of left patella, initial encounter for closed fracture: Secondary | ICD-10-CM | POA: Insufficient documentation

## 2017-07-13 DIAGNOSIS — Z79899 Other long term (current) drug therapy: Secondary | ICD-10-CM | POA: Diagnosis not present

## 2017-07-13 DIAGNOSIS — N183 Chronic kidney disease, stage 3 (moderate): Secondary | ICD-10-CM | POA: Insufficient documentation

## 2017-07-13 DIAGNOSIS — I509 Heart failure, unspecified: Secondary | ICD-10-CM | POA: Insufficient documentation

## 2017-07-13 DIAGNOSIS — Z87891 Personal history of nicotine dependence: Secondary | ICD-10-CM | POA: Diagnosis not present

## 2017-07-13 DIAGNOSIS — Y999 Unspecified external cause status: Secondary | ICD-10-CM | POA: Diagnosis not present

## 2017-07-13 DIAGNOSIS — S199XXA Unspecified injury of neck, initial encounter: Secondary | ICD-10-CM | POA: Diagnosis not present

## 2017-07-13 DIAGNOSIS — W101XXA Fall (on)(from) sidewalk curb, initial encounter: Secondary | ICD-10-CM | POA: Insufficient documentation

## 2017-07-13 DIAGNOSIS — S098XXA Other specified injuries of head, initial encounter: Secondary | ICD-10-CM | POA: Diagnosis not present

## 2017-07-13 DIAGNOSIS — S82002A Unspecified fracture of left patella, initial encounter for closed fracture: Secondary | ICD-10-CM | POA: Diagnosis not present

## 2017-07-13 DIAGNOSIS — Z7982 Long term (current) use of aspirin: Secondary | ICD-10-CM | POA: Diagnosis not present

## 2017-07-13 MED ORDER — ACETAMINOPHEN 325 MG PO TABS: 650.0000 mg | ORAL_TABLET | ORAL | 0 refills | Status: AC | PRN

## 2017-07-13 NOTE — ED Provider Notes (Signed)
[facility] Emergency Department Provider Note  ____________________________________________  Time seen: Approximately 4:14 PM  I have reviewed the triage vital signs and the nursing notes.   HISTORY  Chief Complaint Fall    HPI Erin Ibarra is a 84 y.o. female that presents to the emergency department for evaluation after fall.  Patient tripped on a curb and twisted her knee.  She hit her head on the pavement and says that she hit the top of her head and did not land on her face.  She denies any facial trauma.  No loss of consciousness.  She has been walking since fall but with pain.  Pain is worse with movement.  She takes a baby aspirin daily.  No headache, visual changes, dizziness, neck pain, shortness of breath, chest pain, nausea, vomiting, abdominal pain, numbness, tingling.  Past Medical History:  Diagnosis Date  . Cancer [facility]) 1991   Bladder  . Chronic kidney disease   . Glaucoma   . Heart murmur   . Hypertension   . Thyroid disease     Patient Active Problem List   Diagnosis Date Noted  . Benign hypertensive heart and CKD, stage 3 (GFR 30-59), w CHF ([place]) 09/09/2015  . Hyperkalemia 09/09/2015  . Amblyopia 09/06/2015  . Back ache 09/06/2015  . Aortic valve, bicuspid 09/06/2015  . Bladder cancer ([place]) 09/06/2015  . Colon polyp 09/06/2015  . DD (diverticular disease) 09/06/2015  . Breathlessness on exertion 09/06/2015  . Hypercholesteremia 09/06/2015  . Below normal amount of sodium in the blood 09/06/2015  . Cervical pain 09/06/2015  . Amnesia 09/06/2015  . Post menopausal syndrome 09/06/2015  . Disorder of scalp 09/06/2015  . Hypo-osmolality and hyponatremia 09/06/2015  . Diverticulitis 09/06/2015  . Aortic regurgitation, congenital 09/06/2015  . OP (osteoporosis) 05/20/2015  . Age-related osteoporosis without current pathological fracture 05/20/2015  . Hypothyroidism 12/26/2014  . Encounter for screening colonoscopy  08/29/2014  . BP (high blood pressure) 02/03/2013  . Difficulty hearing 02/03/2013  . Cataract 10/14/2012  . Central retinal vein occlusion 10/14/2012  . 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 Cardiac failure ([place]) 06/21/2008  . Heart failure ([place]) 06/21/2008    Past Surgical History:  Procedure Laterality Date  . CATARACT EXTRACTION  2015  . COLONOSCOPY  2006  . TRANSURETHRAL RESECTION OF BLADDER  1991    Prior to Admission medications   Medication Sig Start Date End Date Taking? Authorizing Provider  acetaminophen (TYLENOL) 325 MG tablet Take 2 tablets (650 mg total) by mouth every 4 (four) hours as needed for up to 5 days. 07/13/17 07/18/17  Laban Emperor, PA-C  aspirin 81 MG tablet Take 81 mg by mouth daily.    [provider]  azithromycin (ZITHROMAX) 250 MG tablet Take 2 tabs PO x1 day, then 1 tab PO daily x4 days 06/11/17   Virginia Crews, MD  brimonidine [facility]) 0.2 % ophthalmic solution  02/24/17   [provider]  Calcium Carbonate-Vit D-Min (CALCIUM 600 + MINERALS PO) Take 1 tablet by mouth daily.    [provider]  carvedilol (COREG) 25 MG tablet Take 12.5 mg by mouth 2 (two) times daily.  08/17/14   [provider]  dorzolamide-timolol (COSOPT) 22.3-6.8 MG/ML ophthalmic solution USE 1 DROP(S) IN BOTH EYES 2 TIMES A DAY 08/19/15   [provider]  Fish Oil-Cholecalciferol (FISH OIL + D3) 1000-1000 MG-UNIT CAPS Take by mouth 2 (two) times daily.    [provider]  hydrochlorothiazide (HYDRODIURIL) 25 MG  tablet Take 1 tablet (25 mg total) by mouth daily. 09/25/16   Jerrol Banana., MD  levothyroxine (SYNTHROID, LEVOTHROID) 75 MCG tablet TAKE 1 TABLET BY MOUTH EVERY DAY. NEEDS AN OV. 04/12/17   Jerrol Banana., MD  Multiple Vitamins-Minerals (MULTIVITAMIN WITH MINERALS) tablet Take 1 tablet by mouth daily.    [provider]  valsartan (DIOVAN) 80 MG tablet Take 80 mg by mouth daily.  07/23/14    [provider]  VYZULTA 0.024 % SOLN INSTILL 1 DROP INTO RIGHT EYE IN THE EVENING 05/10/17   [provider]    Allergies Patient has no known allergies.  Family History  Problem Relation Age of Onset  . Prostate cancer Father   . Cancer Father        prostate  . Heart disease Father        MI  . Pancreatic cancer Brother   . Lung cancer Brother   . Cancer Brother        brain, lung  . Heart disease Brother        MI    Social History Social History   Tobacco Use  . Smoking status: Former Smoker    Packs/day: 1.00    Years: 15.00    Pack years: 15.00    Types: Cigarettes  . Smokeless tobacco: Never Used  Substance Use Topics  . Alcohol use: Yes    Alcohol/week: 0.0 oz    Comment: 1 glass of wine qhs  . Drug use: No     Review of Systems  Cardiovascular: No chest pain. Respiratory: No SOB. Gastrointestinal: No abdominal pain.  No nausea, no vomiting.  Musculoskeletal: Positive for knee pain. Skin: Negative for rash, abrasions, lacerations, ecchymosis. Neurological: Negative for headaches, numbness or tingling   ____________________________________________   PHYSICAL EXAM:  VITAL SIGNS: ED Triage Vitals  Enc Vitals Group     BP 07/13/17 1527 (!) 134/56     Pulse Rate 07/13/17 1527 83     Resp 07/13/17 1527 14     Temp 07/13/17 1527 98 F (36.7 C)     Temp Source 07/13/17 1527 Oral     SpO2 07/13/17 1527 98 %     Weight 07/13/17 1527 110 lb (49.9 kg)     Height 07/13/17 1527 5\' 2"  (1.575 m)     Head Circumference --      Peak Flow --      Pain Score 07/13/17 1548 8     Pain Loc --      Pain Edu? --      Excl. in [place]? --      Constitutional: Alert and oriented. Well appearing and in no acute distress. Eyes: Conjunctivae are normal. PERRL. EOMI.  Head: Ecchymosis lateral to left eye and left forehead. Patient points to left side of hairline to area that hit the ground.  ENT:      Ears:      Nose: No congestion/rhinnorhea.       Mouth/Throat: Mucous membranes are moist.  Neck: No stridor.  Cardiovascular: Normal rate, regular rhythm.  Good peripheral circulation. Palpable dorsalis pedis pulses.  Respiratory: Normal respiratory effort without tachypnea or retractions. Lungs CTAB. Good air entry to the bases with no decreased or absent breath sounds. Musculoskeletal: No gross deformities appreciated. Mild swelling to left knee. Tenderness to palpation over patella. Patient able to actively flex and extend knee.  Neurologic:  Normal speech and language. No gross focal neurologic deficits are appreciated.  Skin:  Skin is warm, dry.    ____________________________________________   LABS (all labs ordered are listed, but only abnormal results are displayed)  Labs Reviewed - No data to display ____________________________________________  EKG   ____________________________________________  RADIOLOGY Robinette Haines, personally viewed and evaluated these images (plain radiographs) as part of my medical decision making, as well as reviewing the written report by the radiologist.  Ct Head Wo Contrast  Result Date: 07/13/2017 CLINICAL DATA:  Fall.  Stepped off of a curb and head hit pavement. EXAM: CT HEAD WITHOUT CONTRAST CT CERVICAL SPINE WITHOUT CONTRAST TECHNIQUE: Multidetector CT imaging of the head and cervical spine was performed following the standard protocol without intravenous contrast. Multiplanar CT image reconstructions of the cervical spine were also generated. COMPARISON:  Brain MRI 10/31/2012 FINDINGS: CT HEAD FINDINGS Brain: There is no evidence for acute hemorrhage, hydrocephalus, mass lesion, or abnormal extra-axial fluid collection. No definite CT evidence for acute infarction. Age-appropriate atrophy. Patchy low attenuation in the deep hemispheric and periventricular white matter is nonspecific, but likely reflects chronic microvascular ischemic demyelination. Vascular: No hyperdense vessel or  unexpected calcification. Skull: No evidence for fracture. No worrisome lytic or sclerotic lesion. Sinuses/Orbits: The visualized paranasal sinuses and mastoid air cells are clear. Visualized portions of the globes and intraorbital fat are unremarkable. Other: None. CT CERVICAL SPINE FINDINGS Alignment: Straightening of normal cervical lordosis. Mild anterolisthesis of of C4 on 5 is compatible with the advanced left-sided facet osteoarthritis. Skull base and vertebrae: No acute fracture. No primary bone lesion or focal pathologic process. Soft tissues and spinal canal: No prevertebral fluid or swelling. No visible canal hematoma. Disc levels:  Loss of disc height is noted at C5-6 and C6-7. Upper chest: Unremarkable. Other: None. IMPRESSION: 1. No acute intracranial abnormality. Mild white matter chronic ischemic change. 2. Degenerative changes in the cervical spine without fracture. 3. Loss of cervical lordosis. This can be related to patient positioning, muscle spasm or soft tissue injury. Electronically Signed   By: Misty Stanley M.D.   On: 07/13/2017 16:46   Ct Cervical Spine Wo Contrast  Result Date: 07/13/2017 CLINICAL DATA:  Fall.  Stepped off of a curb and head hit pavement. EXAM: CT HEAD WITHOUT CONTRAST CT CERVICAL SPINE WITHOUT CONTRAST TECHNIQUE: Multidetector CT imaging of the head and cervical spine was performed following the standard protocol without intravenous contrast. Multiplanar CT image reconstructions of the cervical spine were also generated. COMPARISON:  Brain MRI 10/31/2012 FINDINGS: CT HEAD FINDINGS Brain: There is no evidence for acute hemorrhage, hydrocephalus, mass lesion, or abnormal extra-axial fluid collection. No definite CT evidence for acute infarction. Age-appropriate atrophy. Patchy low attenuation in the deep hemispheric and periventricular white matter is nonspecific, but likely reflects chronic microvascular ischemic demyelination. Vascular: No hyperdense vessel or  unexpected calcification. Skull: No evidence for fracture. No worrisome lytic or sclerotic lesion. Sinuses/Orbits: The visualized paranasal sinuses and mastoid air cells are clear. Visualized portions of the globes and intraorbital fat are unremarkable. Other: None. CT CERVICAL SPINE FINDINGS Alignment: Straightening of normal cervical lordosis. Mild anterolisthesis of of C4 on 5 is compatible with the advanced left-sided facet osteoarthritis. Skull base and vertebrae: No acute fracture. No primary bone lesion or focal pathologic process. Soft tissues and spinal canal: No prevertebral fluid or swelling. No visible canal hematoma. Disc levels:  Loss of disc height is noted at C5-6 and C6-7. Upper chest: Unremarkable. Other: None. IMPRESSION: 1. No acute intracranial abnormality. Mild white matter chronic ischemic change. 2. Degenerative changes  in the cervical spine without fracture. 3. Loss of cervical lordosis. This can be related to patient positioning, muscle spasm or soft tissue injury. Electronically Signed   By: Misty Stanley M.D.   On: 07/13/2017 16:46   Dg Knee Complete 4 Views Left  Result Date: 07/13/2017 CLINICAL DATA:  [place] onto the pavement.  Pain and swelling. EXAM: LEFT KNEE - COMPLETE 4+ VIEW COMPARISON:  None. FINDINGS: Nondisplaced comminuted fracture of the patella. Moderate-sized joint effusion. No fracture of the femur, tibia or fibula. IMPRESSION: Nondisplaced comminuted patellar fracture.  Joint effusion. Electronically Signed   By: Nelson Chimes M.D.   On: 07/13/2017 17:13    ____________________________________________    PROCEDURES  Procedure(s) performed:    Procedures    Medications - No data to display   ____________________________________________   INITIAL IMPRESSION / ASSESSMENT AND PLAN / ED COURSE  Pertinent labs & imaging results that were available during my care of the patient were reviewed by me and considered in my medical decision making (see chart  for details).  Review of the [place] CSRS was performed in accordance of the [place] prior to dispensing any controlled drugs.  Patient presented to the emergency department for evaluation after fall.  Vital signs and exam are reassuring. CT head and cervical are negative for acute processes. Knee xray consistent with non displaced comminuted patella fracture. Dr. Sabra Heck was consulted, viewed the xray, and recommended 6inch ace wrap, knee immobilizer, walker and to follow up with him in office in 5 days. Pain medication was discussed with patient and she prefers tylenol due to fall risk. She will have someone walk her dog. Son is in room is planning options for having people frequently stop at the house for assistance. Patient is to follow up with orthopedics as directed. Patient is given ED precautions to return to the ED for any worsening or new symptoms.     ____________________________________________  FINAL CLINICAL IMPRESSION(S) / ED DIAGNOSES  Final diagnoses:  Contusion of head, unspecified part of head, initial encounter  Closed nondisplaced comminuted fracture of left patella, initial encounter      NEW MEDICATIONS STARTED DURING THIS VISIT:  ED Discharge Orders        Ordered    acetaminophen (TYLENOL) 325 MG tablet  Every 4 hours PRN     07/13/17 1808          This chart was dictated using voice recognition software/Dragon. Despite best efforts to proofread, errors can occur which can change the meaning. Any change was purely unintentional.    Laban Emperor, PA-C 07/13/17 1918    Schuyler Amor, MD 07/13/17 [phone]

## 2017-07-13 NOTE — ED Triage Notes (Addendum)
Pt in via POV with complaints of mechanical fall, reports twisting left knee as she was stepping off of a curb, falling hitting head on pavement.  Pt denies LOC, denies blood thinners.  Pt denies any dizziness or changes to vision since the fall.  Pt complains of pain to left knee.  Pt A/Ox4, NAD noted at this time.

## 2017-07-13 NOTE — ED Notes (Signed)
Pt with contusion above right eye with bruising around eye. Pt a/o, no c/o headache, dizziness. C/o left knee pain. Knee with contusion front and pain medial side. Pain 2/10 w/o movement, 8/10 with.

## 2017-07-21 DIAGNOSIS — S82009A Unspecified fracture of unspecified patella, initial encounter for closed fracture: Secondary | ICD-10-CM | POA: Insufficient documentation

## 2017-07-21 DIAGNOSIS — S82015A Nondisplaced osteochondral fracture of left patella, initial encounter for closed fracture: Secondary | ICD-10-CM | POA: Diagnosis not present

## 2017-08-11 DIAGNOSIS — S82015A Nondisplaced osteochondral fracture of left patella, initial encounter for closed fracture: Secondary | ICD-10-CM | POA: Diagnosis not present

## 2017-08-25 DIAGNOSIS — S82015A Nondisplaced osteochondral fracture of left patella, initial encounter for closed fracture: Secondary | ICD-10-CM | POA: Diagnosis not present

## 2017-09-02 ENCOUNTER — Ambulatory Visit (INDEPENDENT_AMBULATORY_CARE_PROVIDER_SITE_OTHER): Payer: Medicare Other | Admitting: Family Medicine

## 2017-09-02 VITALS — BP 138/72 | HR 54 | Temp 98.0°F | Resp 16 | Wt 109.0 lb

## 2017-09-02 DIAGNOSIS — E7800 Pure hypercholesterolemia, unspecified: Secondary | ICD-10-CM | POA: Diagnosis not present

## 2017-09-02 DIAGNOSIS — E039 Hypothyroidism, unspecified: Secondary | ICD-10-CM

## 2017-09-02 DIAGNOSIS — I10 Essential (primary) hypertension: Secondary | ICD-10-CM | POA: Diagnosis not present

## 2017-09-02 DIAGNOSIS — G3184 Mild cognitive impairment, so stated: Secondary | ICD-10-CM

## 2017-09-02 MED ORDER — DONEPEZIL HCL 5 MG PO TABS: 5.0000 mg | ORAL_TABLET | Freq: Every day | ORAL | 12 refills | Status: DC

## 2017-09-02 NOTE — Progress Notes (Signed)
Erin Ibarra  MRN: [id number] DOB: 1930/04/06  Subjective:  HPI   The patient is an 84 year old female who presents for follow up of chronic illnesses.  She was last seen in December for an acute visit but prior to that for her annual wellness exam in August.  Since her last visit here she was seen in the ER for contusion of the head with facial trauma after a fall.  She also had at that time a nondisplaced fracture of the patella with joint effusion.  Hypertension-Her blood pressure has been elevated the last few times it was checked but that was during the time she was being seen for her fall. She is starting to have some memory issues.  BP Readings from Last 3 Encounters:  09/02/17 138/72  07/13/17 (!) 134/56  06/10/17 (!) 148/92   Hypothyroidism- Lab Results  Component Value Date   TSH 1.870 03/16/2017    Hypercholesterolemia Lab Results  Component Value Date   CHOL 186 03/16/2017   HDL 75 03/16/2017   LDLCALC 99 03/16/2017   TRIG 58 03/16/2017    Patient Active Problem List   Diagnosis Date Noted  . Benign hypertensive heart and CKD, stage 3 (GFR 30-59), w CHF ([place]) 09/09/2015  . Hyperkalemia 09/09/2015  . Amblyopia 09/06/2015  . Back ache 09/06/2015  . Aortic valve, bicuspid 09/06/2015  . Bladder cancer ([place]) 09/06/2015  . Colon polyp 09/06/2015  . DD (diverticular disease) 09/06/2015  . Breathlessness on exertion 09/06/2015  . Hypercholesteremia 09/06/2015  . Below normal amount of sodium in the blood 09/06/2015  . Cervical pain 09/06/2015  . Amnesia 09/06/2015  . Post menopausal syndrome 09/06/2015  . Disorder of scalp 09/06/2015  . Hypo-osmolality and hyponatremia 09/06/2015  . Diverticulitis 09/06/2015  . Aortic regurgitation, congenital 09/06/2015  . OP (osteoporosis) 05/20/2015  . Age-related osteoporosis without current pathological fracture 05/20/2015  . Hypothyroidism 12/26/2014  . Encounter for screening colonoscopy 08/29/2014  . BP  (high blood pressure) 02/03/2013  . Difficulty hearing 02/03/2013  . Cataract 10/14/2012  . Central retinal vein occlusion 10/14/2012  . 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 Cardiac failure ([place]) 06/21/2008  . Heart failure ([place]) 06/21/2008    Past Medical History:  Diagnosis Date  . Cancer [facility]) 1991   Bladder  . Chronic kidney disease   . Glaucoma   . Heart murmur   . Hypertension   . Thyroid disease     Social History   Socioeconomic History  . Marital status: Widowed    Spouse name: Not on file  . Number of children: Not on file  . Years of education: Not on file  . Highest education level: Not on file  Social Needs  . Financial resource strain: Not on file  . Food insecurity - worry: Not on file  . Food insecurity - inability: Not on file  . Transportation needs - medical: Not on file  . Transportation needs - non-medical: Not on file  Occupational History  . Not on file  Tobacco Use  . Smoking status: Former Smoker    Packs/day: 1.00    Years: 15.00    Pack years: 15.00    Types: Cigarettes  . Smokeless tobacco: Never Used  Substance and Sexual Activity  . Alcohol use: Yes    Alcohol/week: 0.0 oz    Comment: 1 glass of wine qhs  . Drug use: No  . Sexual activity: Not on file  Other Topics Concern  . Not on file  Social History Narrative  . Not on file    Outpatient Encounter Medications as of 09/02/2017  Medication Sig Note  . aspirin 81 MG tablet Take 81 mg by mouth daily.   . brimonidine (ALPHAGAN) 0.2 % ophthalmic solution    . Calcium Carbonate-Vit D-Min (CALCIUM 600 + MINERALS PO) Take 1 tablet by mouth daily.   . carvedilol (COREG) 25 MG tablet Take 12.5 mg by mouth 2 (two) times daily.  08/28/2014: Received from: External Pharmacy  . dorzolamide-timolol (COSOPT) 22.3-6.8 MG/ML ophthalmic solution USE 1 DROP(S) IN BOTH EYES 2 TIMES A DAY 09/06/2015: Received from: External Pharmacy  . Fish Oil-Cholecalciferol (FISH OIL + D3) 1000-1000 MG-UNIT CAPS  Take by mouth 2 (two) times daily.   . hydrochlorothiazide (HYDRODIURIL) 25 MG tablet Take 1 tablet (25 mg total) by mouth daily.   Marland Kitchen levothyroxine (SYNTHROID, LEVOTHROID) 75 MCG tablet TAKE 1 TABLET BY MOUTH EVERY DAY. NEEDS AN OV.   . Multiple Vitamins-Minerals (MULTIVITAMIN WITH MINERALS) tablet Take 1 tablet by mouth daily.   . valsartan (DIOVAN) 80 MG tablet Take 80 mg by mouth daily.  09/02/2017: Patient is on 40 mg daily  . VYZULTA 0.024 % SOLN INSTILL 1 DROP INTO RIGHT EYE IN THE EVENING   . [DISCONTINUED] azithromycin (ZITHROMAX) 250 MG tablet Take 2 tabs PO x1 day, then 1 tab PO daily x4 days    No facility-administered encounter medications on file as of 09/02/2017.     No Known Allergies  Review of Systems  Constitutional: Negative.   HENT: Negative.   Eyes: Negative.   Respiratory: Negative.   Cardiovascular: Negative.   Gastrointestinal: Negative.   Musculoskeletal: Positive for joint pain.  Skin: Negative.   Endo/Heme/Allergies: Negative.   Psychiatric/Behavioral: Negative.     Objective:  BP 138/72 (BP Location: Right Arm, Patient Position: Sitting, Cuff Size: Normal)   Pulse (!) 54   Temp 98 F (36.7 C) (Oral)   Resp 16   Wt 109 lb (49.4 kg)   BMI 19.94 kg/m   Physical Exam  Constitutional: She is oriented to person, place, and time and well-developed, well-nourished, and in no distress.  Thin WF appears younger than her age.  HENT:  Head: Normocephalic and atraumatic.  Eyes: No scleral icterus.  Neck: No thyromegaly present.  Cardiovascular: Normal rate, regular rhythm and normal heart sounds.  Pulmonary/Chest: Effort normal and breath sounds normal.  Abdominal: Soft.  Neurological: She is alert and oriented to person, place, and time.  Skin: Skin is warm and dry.  Psychiatric: Mood, memory, affect and judgment normal.    Assessment and Plan :  1. Essential hypertension   2. Hypothyroidism, unspecified type   3. Hypercholesteremia   4. Mild  cognitive impairment MMSE 29/30 - donepezil (ARICEPT) 5 MG tablet; Take 1 tablet (5 mg total) by mouth at bedtime.  Dispense: 30 tablet; Refill: 12 RTC 6 months.  I have done the exam and reviewed the chart and it is accurate to the best of my knowledge. Development worker, community has been used and  any errors in dictation or transcription are unintentional. Miguel Aschoff M.D. [facility]

## 2017-09-14 DIAGNOSIS — N183 Chronic kidney disease, stage 3 (moderate): Secondary | ICD-10-CM | POA: Diagnosis not present

## 2017-09-14 DIAGNOSIS — I10 Essential (primary) hypertension: Secondary | ICD-10-CM | POA: Diagnosis not present

## 2017-09-14 DIAGNOSIS — E875 Hyperkalemia: Secondary | ICD-10-CM | POA: Diagnosis not present

## 2017-09-23 DIAGNOSIS — S82015A Nondisplaced osteochondral fracture of left patella, initial encounter for closed fracture: Secondary | ICD-10-CM | POA: Diagnosis not present

## 2017-09-28 DIAGNOSIS — Q231 Congenital insufficiency of aortic valve: Secondary | ICD-10-CM | POA: Diagnosis not present

## 2017-09-28 DIAGNOSIS — I519 Heart disease, unspecified: Secondary | ICD-10-CM | POA: Diagnosis not present

## 2017-09-28 DIAGNOSIS — I10 Essential (primary) hypertension: Secondary | ICD-10-CM | POA: Diagnosis not present

## 2017-09-28 DIAGNOSIS — H348122 Central retinal vein occlusion, left eye, stable: Secondary | ICD-10-CM | POA: Diagnosis not present

## 2017-09-28 DIAGNOSIS — I5022 Chronic systolic (congestive) heart failure: Secondary | ICD-10-CM | POA: Diagnosis not present

## 2017-10-05 DIAGNOSIS — R262 Difficulty in walking, not elsewhere classified: Secondary | ICD-10-CM | POA: Diagnosis not present

## 2017-10-05 DIAGNOSIS — M25662 Stiffness of left knee, not elsewhere classified: Secondary | ICD-10-CM | POA: Diagnosis not present

## 2017-10-12 DIAGNOSIS — R262 Difficulty in walking, not elsewhere classified: Secondary | ICD-10-CM | POA: Diagnosis not present

## 2017-10-12 DIAGNOSIS — M25662 Stiffness of left knee, not elsewhere classified: Secondary | ICD-10-CM | POA: Diagnosis not present

## 2017-10-14 DIAGNOSIS — R262 Difficulty in walking, not elsewhere classified: Secondary | ICD-10-CM | POA: Diagnosis not present

## 2017-10-14 DIAGNOSIS — M25662 Stiffness of left knee, not elsewhere classified: Secondary | ICD-10-CM | POA: Diagnosis not present

## 2017-10-18 DIAGNOSIS — M25662 Stiffness of left knee, not elsewhere classified: Secondary | ICD-10-CM | POA: Diagnosis not present

## 2017-10-18 DIAGNOSIS — R262 Difficulty in walking, not elsewhere classified: Secondary | ICD-10-CM | POA: Diagnosis not present

## 2017-10-21 DIAGNOSIS — R262 Difficulty in walking, not elsewhere classified: Secondary | ICD-10-CM | POA: Diagnosis not present

## 2017-10-21 DIAGNOSIS — M25662 Stiffness of left knee, not elsewhere classified: Secondary | ICD-10-CM | POA: Diagnosis not present

## 2017-10-24 ENCOUNTER — Encounter: Payer: Self-pay | Admitting: Emergency Medicine

## 2017-10-24 ENCOUNTER — Emergency Department
Admission: EM | Admit: 2017-10-24 | Discharge: 2017-10-24 | Disposition: A | Discharge status: Home / Self Care | Payer: Medicare Other | Attending: Emergency Medicine | Admitting: Emergency Medicine

## 2017-10-24 ENCOUNTER — Other Ambulatory Visit: Payer: Self-pay

## 2017-10-24 ENCOUNTER — Emergency Department: Payer: Medicare Other

## 2017-10-24 DIAGNOSIS — Z7982 Long term (current) use of aspirin: Secondary | ICD-10-CM | POA: Diagnosis not present

## 2017-10-24 DIAGNOSIS — Z79899 Other long term (current) drug therapy: Secondary | ICD-10-CM | POA: Diagnosis not present

## 2017-10-24 DIAGNOSIS — E039 Hypothyroidism, unspecified: Secondary | ICD-10-CM | POA: Diagnosis not present

## 2017-10-24 DIAGNOSIS — M542 Cervicalgia: Secondary | ICD-10-CM | POA: Diagnosis not present

## 2017-10-24 DIAGNOSIS — N183 Chronic kidney disease, stage 3 (moderate): Secondary | ICD-10-CM | POA: Insufficient documentation

## 2017-10-24 DIAGNOSIS — I130 Hypertensive heart and chronic kidney disease with heart failure and stage 1 through stage 4 chronic kidney disease, or unspecified chronic kidney disease: Secondary | ICD-10-CM | POA: Diagnosis not present

## 2017-10-24 DIAGNOSIS — I509 Heart failure, unspecified: Secondary | ICD-10-CM | POA: Insufficient documentation

## 2017-10-24 DIAGNOSIS — Z87891 Personal history of nicotine dependence: Secondary | ICD-10-CM | POA: Diagnosis not present

## 2017-10-24 DIAGNOSIS — R55 Syncope and collapse: Secondary | ICD-10-CM | POA: Diagnosis not present

## 2017-10-24 DIAGNOSIS — Z8551 Personal history of malignant neoplasm of bladder: Secondary | ICD-10-CM | POA: Diagnosis not present

## 2017-10-24 LAB — CBC
HCT: 40.4 % (ref 35.0–47.0)
Hemoglobin: 13.6 g/dL (ref 12.0–16.0)
MCH: 32.6 pg (ref 26.0–34.0)
MCHC: 33.7 g/dL (ref 32.0–36.0)
MCV: 96.8 fL (ref 80.0–100.0)
PLATELETS: 316 10*3/uL (ref 150–440)
RBC: 4.17 MIL/uL (ref 3.80–5.20)
RDW: 13.3 % (ref 11.5–14.5)
WBC: 7.7 10*3/uL (ref 3.6–11.0)

## 2017-10-24 LAB — BASIC METABOLIC PANEL
Anion gap: 9 (ref 5–15)
BUN: 49 mg/dL — AB (ref 6–20)
CHLORIDE: 95 mmol/L — AB (ref 101–111)
CO2: 29 mmol/L (ref 22–32)
CREATININE: 1.52 mg/dL — AB (ref 0.44–1.00)
Calcium: 9.1 mg/dL (ref 8.9–10.3)
GFR calc Af Amer: 35 mL/min — ABNORMAL LOW (ref >60)
GFR calc non Af Amer: 30 mL/min — ABNORMAL LOW (ref >60)
Glucose, Bld: 111 mg/dL — ABNORMAL HIGH (ref 65–99)
Potassium: 4.2 mmol/L (ref 3.5–5.1)
Sodium: 133 mmol/L — ABNORMAL LOW (ref 135–145)

## 2017-10-24 LAB — URINALYSIS, COMPLETE (UACMP) WITH MICROSCOPIC
Bilirubin Urine: NEGATIVE
GLUCOSE, UA: NEGATIVE mg/dL
HGB URINE DIPSTICK: NEGATIVE
Ketones, ur: NEGATIVE mg/dL
Leukocytes, UA: NEGATIVE
Nitrite: NEGATIVE
PH: 6 (ref 5.0–8.0)
Protein, ur: NEGATIVE mg/dL
SPECIFIC GRAVITY, URINE: 1.012 (ref 1.005–1.030)

## 2017-10-24 MED ORDER — SODIUM CHLORIDE 0.9 % IV BOLUS
500.0000 mL | Freq: Once | INTRAVENOUS | Status: AC
  Administered 2017-10-24: 500 mL via INTRAVENOUS

## 2017-10-24 MED ORDER — IOHEXOL 350 MG/ML SOLN
60.0000 mL | Freq: Once | INTRAVENOUS | Status: AC | PRN
  Administered 2017-10-24: 60 mL via INTRAVENOUS

## 2017-10-24 NOTE — ED Triage Notes (Signed)
Pt presents to ED via POV with her son from church. Pt reports syncopal episode today, was cleared by EMS unit on scene and then came POV to hospital. Pt reports that she thinks it is because the pew puts her neck at "a weird angle". When asked how long the episode lasted the patient's son answered with "well we thought was asleep so about 10 minutes". Pt is A&Ox4, in NAD during triage.

## 2017-10-24 NOTE — ED Notes (Signed)
Pt reports that while sitting in church this morning that she had a syncopal episode. Family reports that episode lasted 5-10 minutes, during this time pt was unresponsive but she had her eyes open. Pt reports that before episode she had some pain in her neck, which she states is not unusual because of the "angle of the church pew". Pt states that she has no memory of the syncopal episode. Family reports that she was not confused after episodes, just confused as to what had happened to her. Pt has no slurred speech and family reports that she is currently at her baseline.

## 2017-10-24 NOTE — Discharge Instructions (Addendum)
your lab tests and CT scan of the neck were all unremarkable today.  The CT scan did include upper portion of your lung, which had an incidental finding of a 5 mm nodule in the upper lung. This finding it is of uncertain significance at this time, and should be monitored by your primary care doctor.

## 2017-10-24 NOTE — ED Notes (Signed)
Pt denies needs, pt updated on care plan. Family sitting with pt.

## 2017-10-24 NOTE — ED Notes (Signed)
Attempted IV access x 2 unsuccessfully. Anda Kraft, RN to attempt access.

## 2017-10-24 NOTE — ED Provider Notes (Signed)
[facility] Emergency Department Provider Note  ____________________________________________  Time seen: Approximately 5:46 PM  I have reviewed the triage vital signs and the nursing notes.   HISTORY  Chief Complaint Loss of Consciousness    HPI Erin Ibarra is a 84 y.o. female with a history of hypertension, CK D who comes to the ED due to syncope. She was in her usual state of health, ate normal breakfast this morning and then was sitting in church for [place] service when she got dizzy and passed out briefly. before passing out, she was having right neck pain which she states is a recurrent chronic issue for her but today was feeling particularly severe. She can return to shift around to get comfortable prior to passing out. She denies any acute vision changes headaches numbness tingling weakness or other neurologic deficits. It is reported that on scene and medics found her blood pressure to be somewhat low at 90/60 on arrival. Patient denies any chest pain shortness of breath back pain or abdominal pain. Other than the syncope no acute complaints. She has chronic right shoulder pain , chronic right neck pain     Past Medical History:  Diagnosis Date  . Cancer [facility]) 1991   Bladder  . Chronic kidney disease   . Glaucoma   . Heart murmur   . Hypertension   . Thyroid disease      Patient Active Problem List   Diagnosis Date Noted  . Benign hypertensive heart and CKD, stage 3 (GFR 30-59), w CHF ([place]) 09/09/2015  . Hyperkalemia 09/09/2015  . Amblyopia 09/06/2015  . Back ache 09/06/2015  . Aortic valve, bicuspid 09/06/2015  . Bladder cancer ([place]) 09/06/2015  . Colon polyp 09/06/2015  . DD (diverticular disease) 09/06/2015  . Breathlessness on exertion 09/06/2015  . Hypercholesteremia 09/06/2015  . Below normal amount of sodium in the blood 09/06/2015  . Cervical pain 09/06/2015  . Amnesia 09/06/2015  . Post menopausal syndrome 09/06/2015   . Disorder of scalp 09/06/2015  . Hypo-osmolality and hyponatremia 09/06/2015  . Diverticulitis 09/06/2015  . Aortic regurgitation, congenital 09/06/2015  . OP (osteoporosis) 05/20/2015  . Age-related osteoporosis without current pathological fracture 05/20/2015  . Hypothyroidism 12/26/2014  . Encounter for screening colonoscopy 08/29/2014  . BP (high blood pressure) 02/03/2013  . Difficulty hearing 02/03/2013  . Cataract 10/14/2012  . Central retinal vein occlusion 10/14/2012  . 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 Cardiac failure ([place]) 06/21/2008  . Heart failure ([place]) 06/21/2008     Past Surgical History:  Procedure Laterality Date  . CATARACT EXTRACTION  2015  . COLONOSCOPY  2006  . TRANSURETHRAL RESECTION OF BLADDER  1991     Prior to Admission medications   Medication Sig Start Date End Date Taking? Authorizing Provider  aspirin 81 MG tablet Take 81 mg by mouth daily.    [provider]  brimonidine (ALPHAGAN) 0.2 % ophthalmic solution  02/24/17   [provider]  Calcium Carbonate-Vit D-Min (CALCIUM 600 + MINERALS PO) Take 1 tablet by mouth daily.    [provider]  carvedilol (COREG) 25 MG tablet Take 12.5 mg by mouth 2 (two) times daily.  08/17/14   [provider]  donepezil (ARICEPT) 5 MG tablet Take 1 tablet (5 mg total) by mouth at bedtime. 09/02/17   Jerrol Banana., MD  dorzolamide-timolol (COSOPT) 22.3-6.8 MG/ML ophthalmic solution USE 1 DROP(S) IN BOTH EYES 2 TIMES A DAY 08/19/15   [provider]  Fish Oil-Cholecalciferol (  FISH OIL + D3) 1000-1000 MG-UNIT CAPS Take by mouth 2 (two) times daily.    [provider]  hydrochlorothiazide (HYDRODIURIL) 25 MG tablet Take 1 tablet (25 mg total) by mouth daily. 09/25/16   Jerrol Banana., MD  levothyroxine (SYNTHROID, LEVOTHROID) 75 MCG tablet TAKE 1 TABLET BY MOUTH EVERY DAY. NEEDS AN OV. 04/12/17   Jerrol Banana., MD  Multiple Vitamins-Minerals  (MULTIVITAMIN WITH MINERALS) tablet Take 1 tablet by mouth daily.    [provider]  valsartan (DIOVAN) 80 MG tablet Take 80 mg by mouth daily.  07/23/14   [provider]  VYZULTA 0.024 % SOLN INSTILL 1 DROP INTO RIGHT EYE IN THE EVENING 05/10/17   [provider]     Allergies Patient has no known allergies.   Family History  Problem Relation Age of Onset  . Prostate cancer Father   . Cancer Father        prostate  . Heart disease Father        MI  . Pancreatic cancer Brother   . Lung cancer Brother   . Cancer Brother        brain, lung  . Heart disease Brother        MI    Social History Social History   Tobacco Use  . Smoking status: Former Smoker    Packs/day: 1.00    Years: 15.00    Pack years: 15.00    Types: Cigarettes  . Smokeless tobacco: Never Used  Substance Use Topics  . Alcohol use: Yes    Alcohol/week: 0.0 oz    Comment: 1 glass of wine qhs  . Drug use: No    Review of Systems  Constitutional:   No fever or chills.  ENT:   No sore throat. No rhinorrhea. Cardiovascular:   No chest pain or syncope. Respiratory:   No dyspnea or cough. Gastrointestinal:   Negative for abdominal pain, vomiting and diarrhea.  Musculoskeletal:   and chronic right shoulder and right neck pain All other systems reviewed and are negative except as documented above in ROS and HPI.  ____________________________________________   PHYSICAL EXAM:  VITAL SIGNS: ED Triage Vitals  Enc Vitals Group     BP 10/24/17 1313 (!) 148/69     Pulse Rate 10/24/17 1313 66     Resp 10/24/17 1313 18     Temp 10/24/17 1313 97.9 F (36.6 C)     Temp Source 10/24/17 1313 Oral     SpO2 10/24/17 1313 96 %     Weight 10/24/17 1314 110 lb (49.9 kg)     Height 10/24/17 1314 5\' 2"  (1.575 m)     Head Circumference --      Peak Flow --      Pain Score 10/24/17 1316 0     Pain Loc --      Pain Edu? --      Excl. in [place]? --     Vital signs reviewed, nursing  assessments reviewed.   Constitutional:   Alert and oriented. Well appearing and in no distress. Eyes:   Conjunctivae are normal. EOMI. PERRL. ENT      Head:   Normocephalic and atraumatic.      Nose:   No congestion/rhinnorhea.       Mouth/Throat:   MMM, no pharyngeal erythema. No peritonsillar mass.       Neck:   No meningismus. Full ROM.and nontender. No midline tenderness. No muscular tenseness. No bruit. Hematological/Lymphatic/Immunilogical:  No cervical lymphadenopathy. Cardiovascular:   RRR. Symmetric bilateral radial and DP pulses.  No murmurs.  Respiratory:   Normal respiratory effort without tachypnea/retractions. Breath sounds are clear and equal bilaterally. No wheezes/rales/rhonchi. Gastrointestinal:   Soft and nontender. Non distended. There is no CVA tenderness.  No rebound, rigidity, or guarding. Genitourinary:   deferred Musculoskeletal:   Normal range of motion in all extremities. No joint effusions.  No lower extremity tenderness.  No edema. Neurologic:   Normal speech and language.  Motor grossly intact. cranial nerves II through XII intact. Normal cerebellar function No acute focal neurologic deficits are appreciated.  Skin:    Skin is warm, dry and intact. No rash noted.  No petechiae, purpura, or bullae.  ____________________________________________    LABS (pertinent positives/negatives) (all labs ordered are listed, but only abnormal results are displayed) Labs Reviewed  BASIC METABOLIC PANEL - Abnormal; Notable for the following components:      Result Value   Sodium 133 (*)    Chloride 95 (*)    Glucose, Bld 111 (*)    BUN 49 (*)    Creatinine, Ser 1.52 (*)    GFR calc non Af Amer 30 (*)    GFR calc Af Amer 35 (*)    All other components within normal limits  URINALYSIS, COMPLETE (UACMP) WITH MICROSCOPIC - Abnormal; Notable for the following components:   Color, Urine YELLOW (*)    APPearance CLEAR (*)    Squamous Epithelial / LPF 0-5 (*)     All other components within normal limits  CBC   ____________________________________________   EKG  interpreted by me Normal sinus rhythm rate of 66, left axis, normal intervals. Left bundle branch block. No acute ischemic changes.  ____________________________________________    RADIOLOGY  Ct Angio Neck W And/or Wo Contrast  Result Date: 10/24/2017 CLINICAL DATA:  Syncopal episode at church, neck pain. EXAM: CT ANGIOGRAPHY NECK TECHNIQUE: Multidetector CT imaging of the neck was performed using the standard protocol during bolus administration of intravenous contrast. Multiplanar CT image reconstructions and MIPs were obtained to evaluate the vascular anatomy. Carotid stenosis measurements (when applicable) are obtained utilizing NASCET criteria, using the distal internal carotid diameter as the denominator. CONTRAST:  52mL OMNIPAQUE IOHEXOL 350 MG/ML SOLN COMPARISON:  CT cervical spine July 13, 2017 FINDINGS: Motion degraded examination. AORTIC ARCH: Normal appearance of the thoracic arch, normal branch pattern. Mild calcific atherosclerosis. The origins of the innominate, left Common carotid artery and subclavian artery are widely patent. RIGHT CAROTID SYSTEM: Common carotid artery is widely patent, coursing in a straight line fashion. Severe motion through the carotid bifurcation and proximal carotid artery. Patent internal carotid artery. LEFT CAROTID SYSTEM: Common carotid artery is widely patent, coursing in a straight line fashion. Severe motion through the carotid bifurcation and proximal carotid artery. Patent internal carotid artery. VERTEBRAL ARTERIES:Motion obscures bilateral vertebral artery origins. RIGHT vertebral artery is dominant. Patent vertebral arteries. SKELETON: No acute osseous process though bone windows have not been submitted. Osteopenia. Grade 1 C 3 4 anterolisthesis. Severe LEFT C4-5, RIGHT greater than LEFT C5-6 and C6-7 neural foraminal narrowing. OTHER NECK: Soft  tissues of the neck are nonacute though, not tailored for evaluation. UPPER CHEST: Multiple solid pulmonary nodules measuring to 5 mm RIGHT upper lobe (series 4, image 2). Extensive tree-in-bud infiltrates. IMPRESSION: 1. Motion degraded examination, nondiagnostic assessment of vertebral artery origins and mid carotid arteries. Patent vessels. 2. Severe C4-5 through C6-7 neural foraminal narrowing. 3. **An incidental finding of potential clinical  significance has been found. Tree-in-bud infiltrates may be infectious or inflammatory. Additional multiple pulmonary nodules measuring to 5 mm. Recommend contrast-enhanced CT chest for further evaluation. ** Aortic Atherosclerosis (ICD10-I70.0). Electronically Signed   By: Elon Alas M.D.   On: 10/24/2017 19:43    ____________________________________________   PROCEDURES Procedures  ____________________________________________  DIFFERENTIAL DIAGNOSIS   carotid dissection, vertebral dissection or occlusion  CLINICAL IMPRESSION / ASSESSMENT AND PLAN / ED COURSE  Pertinent labs & imaging results that were available during my care of the patient were reviewed by me and considered in my medical decision making (see chart for details).    patient well appearing, no acute distress, currently back to baseline.  With lack of headache or acute neurologic deficits there is low suspicion of intracranial pathology. We will get a CT angiogram of the neck vasculature. If negative, I think this is likely a vagal reaction to the pain and she is suitable for discharge home and follow-up with her cardiologist.  Clinical Course as of Oct 25 2014  Sun Oct 24, 2017  2012 CT neck without any acute findings. There is an incidental finding in the upper lung, but she has absolutely no respiratory complaints at all. We'll defer this to outpatient follow-up. At this point, low suspicion of any underlying carotid or vertebral artery dissection or occlusion. She is  neurologically intact. This appears to be vagal episode secondary to exacerbation of chronic musculoskeletal pain from her underlying degenerative spinal disease and scoliosis..   [PS]    Clinical Course User Index [PS] Carrie Mew, MD    ----------------------------------------- 8:15 PM on 10/24/2017 -----------------------------------------  No recurrent symptoms. Hemodynamically stable, well-appearing. Suitable for discharge home with overall unremarkable CT scan. Patient will be informed of incidental lung nodule finding.   ____________________________________________   FINAL CLINICAL IMPRESSION(S) / ED DIAGNOSES    Final diagnoses:  Syncope, unspecified syncope type     ED Discharge Orders    None      Portions of this note were generated with dragon dictation software. Dictation errors may occur despite best attempts at proofreading.    Carrie Mew, MD 10/24/17 2016

## 2017-10-26 ENCOUNTER — Ambulatory Visit (INDEPENDENT_AMBULATORY_CARE_PROVIDER_SITE_OTHER): Payer: Medicare Other | Admitting: Family Medicine

## 2017-10-26 VITALS — BP 142/80 | HR 94 | Temp 97.7°F | Resp 18 | Wt 106.0 lb

## 2017-10-26 DIAGNOSIS — R55 Syncope and collapse: Secondary | ICD-10-CM | POA: Diagnosis not present

## 2017-10-26 DIAGNOSIS — R911 Solitary pulmonary nodule: Secondary | ICD-10-CM

## 2017-10-26 DIAGNOSIS — N183 Chronic kidney disease, stage 3 (moderate): Secondary | ICD-10-CM

## 2017-10-26 DIAGNOSIS — I130 Hypertensive heart and chronic kidney disease with heart failure and stage 1 through stage 4 chronic kidney disease, or unspecified chronic kidney disease: Secondary | ICD-10-CM | POA: Diagnosis not present

## 2017-10-26 DIAGNOSIS — C679 Malignant neoplasm of bladder, unspecified: Secondary | ICD-10-CM | POA: Diagnosis not present

## 2017-10-26 DIAGNOSIS — R918 Other nonspecific abnormal finding of lung field: Secondary | ICD-10-CM | POA: Diagnosis not present

## 2017-10-26 NOTE — Progress Notes (Signed)
Erin Ibarra  MRN: [id number] DOB: [birth year]  Subjective:  HPI  The patient is an 67 female who presents for follow up of ER visit.  The patient states she was in church and had more significant neck pain than she usually does prior to passing out. She was seen at [facility].  She did not have any chest pain, shortness of breath, headaches, back or abdominal pain.  Her blood pressure was low at 90/60.  EKG was NSR with no ischemic changes.  CT of the neck was basically WNL.   ER diagnosed her with probable vasovagal and was advised to follow up with cardio. The patient is feeling much better.  She states that she does get some shortness of breath with exertion but that has been chronic and is unchanged.  Patient Active Problem List   Diagnosis Date Noted  . Benign hypertensive heart and CKD, stage 3 (GFR 30-59), w CHF ([place]) 09/09/2015  . Hyperkalemia 09/09/2015  . Amblyopia 09/06/2015  . Back ache 09/06/2015  . Aortic valve, bicuspid 09/06/2015  . Bladder cancer ([place]) 09/06/2015  . Colon polyp 09/06/2015  . DD (diverticular disease) 09/06/2015  . Breathlessness on exertion 09/06/2015  . Hypercholesteremia 09/06/2015  . Below normal amount of sodium in the blood 09/06/2015  . Cervical pain 09/06/2015  . Amnesia 09/06/2015  . Post menopausal syndrome 09/06/2015  . Disorder of scalp 09/06/2015  . Hypo-osmolality and hyponatremia 09/06/2015  . Diverticulitis 09/06/2015  . Aortic regurgitation, congenital 09/06/2015  . OP (osteoporosis) 05/20/2015  . Age-related osteoporosis without current pathological fracture 05/20/2015  . Hypothyroidism 12/26/2014  . Encounter for screening colonoscopy 08/29/2014  . BP (high blood pressure) 02/03/2013  . Difficulty hearing 02/03/2013  . Cataract 10/14/2012  . Central retinal vein occlusion 10/14/2012  . 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 Cardiac failure ([place]) 06/21/2008  . Heart failure ([place]) 06/21/2008    Past Medical History:    Diagnosis Date  . Cancer [facility]) 1991   Bladder  . Chronic kidney disease   . Glaucoma   . Heart murmur   . Hypertension   . Thyroid disease     Social History   Socioeconomic History  . Marital status: Widowed    Spouse name: Not on file  . Number of children: Not on file  . Years of education: Not on file  . Highest education level: Not on file  Occupational History  . Not on file  Social Needs  . Financial resource strain: Not on file  . Food insecurity:    Worry: Not on file    Inability: Not on file  . Transportation needs:    Medical: Not on file    Non-medical: Not on file  Tobacco Use  . Smoking status: Former Smoker    Packs/day: 1.00    Years: 15.00    Pack years: 15.00    Types: Cigarettes  . Smokeless tobacco: Never Used  Substance and Sexual Activity  . Alcohol use: Yes    Alcohol/week: 0.0 oz    Comment: 1 glass of wine qhs  . Drug use: No  . Sexual activity: Not on file  Lifestyle  . Physical activity:    Days per week: Not on file    Minutes per session: Not on file  . Stress: Not on file  Relationships  . Social connections:    Talks on phone: Not on file    Gets together: Not on file    Attends religious service: Not on  file    Active member of club or organization: Not on file    Attends meetings of clubs or organizations: Not on file    Relationship status: Not on file  . Intimate partner violence:    Fear of current or ex partner: Not on file    Emotionally abused: Not on file    Physically abused: Not on file    Forced sexual activity: Not on file  Other Topics Concern  . Not on file  Social History Narrative  . Not on file    Outpatient Encounter Medications as of 10/26/2017  Medication Sig Note  . aspirin 81 MG tablet Take 81 mg by mouth daily.   . brimonidine (ALPHAGAN) 0.2 % ophthalmic solution    . Calcium Carbonate-Vit D-Min (CALCIUM 600 + MINERALS PO) Take 1 tablet by mouth daily.   . carvedilol (COREG) 25 MG tablet  Take 12.5 mg by mouth 2 (two) times daily.    Marland Kitchen donepezil (ARICEPT) 5 MG tablet Take 1 tablet (5 mg total) by mouth at bedtime.   . dorzolamide-timolol (COSOPT) 22.3-6.8 MG/ML ophthalmic solution USE 1 DROP(S) IN BOTH EYES 2 TIMES A DAY   . Fish Oil-Cholecalciferol (FISH OIL + D3) 1000-1000 MG-UNIT CAPS Take by mouth 2 (two) times daily.   . hydrochlorothiazide (HYDRODIURIL) 25 MG tablet Take 1 tablet (25 mg total) by mouth daily.   Marland Kitchen levothyroxine (SYNTHROID, LEVOTHROID) 75 MCG tablet TAKE 1 TABLET BY MOUTH EVERY DAY. NEEDS AN OV.   . Multiple Vitamins-Minerals (MULTIVITAMIN WITH MINERALS) tablet Take 1 tablet by mouth daily.   . valsartan (DIOVAN) 80 MG tablet Take 80 mg by mouth daily.  09/02/2017: Patient is on 40 mg daily  . VYZULTA 0.024 % SOLN INSTILL 1 DROP INTO RIGHT EYE IN THE EVENING    No facility-administered encounter medications on file as of 10/26/2017.     No Known Allergies  Review of Systems  Constitutional: Positive for malaise/fatigue. Negative for fever.  Eyes: Negative.   Respiratory: Positive for shortness of breath (chronic and unchanged). Negative for cough and wheezing.   Cardiovascular: Negative for chest pain, palpitations and leg swelling.  Gastrointestinal: Negative.   Musculoskeletal: Positive for neck pain.  Skin: Negative.   Endo/Heme/Allergies: Negative.   Psychiatric/Behavioral: Negative.     Objective:  BP (!) 142/80 (BP Location: Right Arm, Patient Position: Sitting, Cuff Size: Normal)   Pulse 94   Temp 97.7 F (36.5 C) (Oral)   Resp 18   Wt 106 lb (48.1 kg)   BMI 19.39 kg/m   Physical Exam  Constitutional: She is oriented to person, place, and time and well-developed, well-nourished, and in no distress.  HENT:  Head: Normocephalic and atraumatic.  Eyes: Conjunctivae are normal. No scleral icterus.  Neck: No thyromegaly present.  Cardiovascular: Normal rate, regular rhythm and normal heart sounds.  Pulmonary/Chest: Effort normal and  breath sounds normal.  Abdominal: Soft.  Musculoskeletal: She exhibits no edema.  No calf swelling.  Neurological: She is alert and oriented to person, place, and time. Gait normal. GCS score is 15.  Grossly nonfocal.  Skin: Skin is warm and dry.  Psychiatric: Memory, affect and judgment normal.    Assessment and Plan :  1. Syncope, unspecified syncope type Vasovagal most likely but needs cardiology evaluation for possible arrhythmia. - EKG 12-Lead  2. Solitary pulmonary nodule  - CT CHEST W CONTRAST; Future  3. Lung nodules  - CT CHEST W CONTRAST; Future 4.h/o Bladder Cancer 5.CKD 6.MCI  I have done the exam and reviewed the chart and it is accurate to the best of my knowledge. Development worker, community has been used and  any errors in dictation or transcription are unintentional. Miguel Aschoff M.D. [facility]

## 2017-11-02 ENCOUNTER — Ambulatory Visit
Admission: RE | Admit: 2017-11-02 | Discharge: 2017-11-02 | Disposition: A | Discharge status: Home / Self Care | Payer: Medicare Other | Source: Ambulatory Visit | Attending: Family Medicine | Admitting: Family Medicine

## 2017-11-02 DIAGNOSIS — I700 Atherosclerosis of aorta: Secondary | ICD-10-CM | POA: Diagnosis not present

## 2017-11-02 DIAGNOSIS — R918 Other nonspecific abnormal finding of lung field: Secondary | ICD-10-CM | POA: Insufficient documentation

## 2017-11-02 DIAGNOSIS — R911 Solitary pulmonary nodule: Secondary | ICD-10-CM

## 2017-11-02 DIAGNOSIS — R0602 Shortness of breath: Secondary | ICD-10-CM | POA: Diagnosis not present

## 2017-11-02 HISTORY — DX: Disorder of kidney and ureter, unspecified: N28.9

## 2017-11-02 HISTORY — DX: Malignant neoplasm of bladder, unspecified: C67.9

## 2017-11-02 LAB — POCT I-STAT CREATININE: Creatinine, Ser: 1.5 mg/dL — ABNORMAL HIGH (ref 0.44–1.00)

## 2017-11-02 MED ORDER — IOHEXOL 300 MG/ML  SOLN
50.0000 mL | Freq: Once | INTRAMUSCULAR | Status: AC | PRN
  Administered 2017-11-02: 50 mL via INTRAVENOUS

## 2017-11-03 ENCOUNTER — Telehealth: Payer: Self-pay | Admitting: Emergency Medicine

## 2017-11-03 DIAGNOSIS — I519 Heart disease, unspecified: Secondary | ICD-10-CM | POA: Diagnosis not present

## 2017-11-03 DIAGNOSIS — Q231 Congenital insufficiency of aortic valve: Secondary | ICD-10-CM | POA: Diagnosis not present

## 2017-11-03 DIAGNOSIS — R55 Syncope and collapse: Secondary | ICD-10-CM | POA: Diagnosis not present

## 2017-11-03 DIAGNOSIS — I10 Essential (primary) hypertension: Secondary | ICD-10-CM | POA: Diagnosis not present

## 2017-11-03 NOTE — Telephone Encounter (Signed)
-----   Message from Jerrol Banana., MD sent at 11/03/2017 12:23 PM EDT ----- Not cancerous--would refer to Pulmonary for possible atypical infection.

## 2017-11-03 NOTE — Telephone Encounter (Signed)
LMTCB

## 2017-11-04 ENCOUNTER — Ambulatory Visit (INDEPENDENT_AMBULATORY_CARE_PROVIDER_SITE_OTHER): Payer: Medicare Other | Admitting: Family Medicine

## 2017-11-04 VITALS — BP 112/68 | HR 80 | Temp 98.1°F | Resp 16 | Wt 104.0 lb

## 2017-11-04 DIAGNOSIS — C679 Malignant neoplasm of bladder, unspecified: Secondary | ICD-10-CM

## 2017-11-04 DIAGNOSIS — I10 Essential (primary) hypertension: Secondary | ICD-10-CM

## 2017-11-04 DIAGNOSIS — G3184 Mild cognitive impairment, so stated: Secondary | ICD-10-CM | POA: Diagnosis not present

## 2017-11-04 DIAGNOSIS — C439 Malignant melanoma of skin, unspecified: Secondary | ICD-10-CM

## 2017-11-04 NOTE — Progress Notes (Signed)
Erin Ibarra  MRN: [id number] DOB: July 14, 1929  Subjective:  HPI   The patient is an 84 year old female who presents today for follow up after starting Aricept.  She states she has been having very vivid dreams.  They are not disturbing and seem to be getting better.  She can not say that she has noticed any real improvement in her memory but does relate that she has not seen any worsening.  The patient mentioned that she was working on conditioning and strength. She states that since her knee issue she is still experiencing difficulty with being off balance.  I have discussed with her the possibility of some physical therapy.  She said that there is some programs at [facility] but would need her physician recommendation.  Patient Active Problem List   Diagnosis Date Noted  . Benign hypertensive heart and CKD, stage 3 (GFR 30-59), w CHF ([place]) 09/09/2015  . Hyperkalemia 09/09/2015  . Amblyopia 09/06/2015  . Back ache 09/06/2015  . Aortic valve, bicuspid 09/06/2015  . Bladder cancer ([place]) 09/06/2015  . Colon polyp 09/06/2015  . DD (diverticular disease) 09/06/2015  . Breathlessness on exertion 09/06/2015  . Hypercholesteremia 09/06/2015  . Below normal amount of sodium in the blood 09/06/2015  . Cervical pain 09/06/2015  . Amnesia 09/06/2015  . Post menopausal syndrome 09/06/2015  . Disorder of scalp 09/06/2015  . Hypo-osmolality and hyponatremia 09/06/2015  . Diverticulitis 09/06/2015  . Aortic regurgitation, congenital 09/06/2015  . OP (osteoporosis) 05/20/2015  . Age-related osteoporosis without current pathological fracture 05/20/2015  . Hypothyroidism 12/26/2014  . Encounter for screening colonoscopy 08/29/2014  . BP (high blood pressure) 02/03/2013  . Difficulty hearing 02/03/2013  . Cataract 10/14/2012  . Central retinal vein occlusion 10/14/2012  . 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 Cardiac failure ([place]) 06/21/2008  . Heart failure ([place]) 06/21/2008    Past  Medical History:  Diagnosis Date  . Bladder cancer ([place]) 1991   Hx TUR and chemo tx's.   . Chronic kidney disease   . Glaucoma   . Heart murmur   . Hypertension   . Renal insufficiency    Hx Stage 3 kidney disease.  . Thyroid disease     Social History   Socioeconomic History  . Marital status: Widowed    Spouse name: Not on file  . Number of children: Not on file  . Years of education: Not on file  . Highest education level: Not on file  Occupational History  . Not on file  Social Needs  . Financial resource strain: Not on file  . Food insecurity:    Worry: Not on file    Inability: Not on file  . Transportation needs:    Medical: Not on file    Non-medical: Not on file  Tobacco Use  . Smoking status: Former Smoker    Packs/day: 1.00    Years: 15.00    Pack years: 15.00    Types: Cigarettes  . Smokeless tobacco: Never Used  Substance and Sexual Activity  . Alcohol use: Yes    Alcohol/week: 0.0 oz    Comment: 1 glass of wine qhs  . Drug use: No  . Sexual activity: Not on file  Lifestyle  . Physical activity:    Days per week: Not on file    Minutes per session: Not on file  . Stress: Not on file  Relationships  . Social connections:    Talks on phone: Not on file  Gets together: Not on file    Attends religious service: Not on file    Active member of club or organization: Not on file    Attends meetings of clubs or organizations: Not on file    Relationship status: Not on file  . Intimate partner violence:    Fear of current or ex partner: Not on file    Emotionally abused: Not on file    Physically abused: Not on file    Forced sexual activity: Not on file  Other Topics Concern  . Not on file  Social History Narrative  . Not on file    Outpatient Encounter Medications as of 11/04/2017  Medication Sig Note  . aspirin 81 MG tablet Take 81 mg by mouth daily.   . brimonidine (ALPHAGAN) 0.2 % ophthalmic solution    . Calcium Carbonate-Vit D-Min  (CALCIUM 600 + MINERALS PO) Take 1 tablet by mouth daily.   . carvedilol (COREG) 25 MG tablet Take 12.5 mg by mouth 2 (two) times daily.    Marland Kitchen donepezil (ARICEPT) 5 MG tablet Take 1 tablet (5 mg total) by mouth at bedtime.   . dorzolamide-timolol (COSOPT) 22.3-6.8 MG/ML ophthalmic solution USE 1 DROP(S) IN BOTH EYES 2 TIMES A DAY   . Fish Oil-Cholecalciferol (FISH OIL + D3) 1000-1000 MG-UNIT CAPS Take by mouth 2 (two) times daily.   . hydrochlorothiazide (HYDRODIURIL) 25 MG tablet Take 1 tablet (25 mg total) by mouth daily.   Marland Kitchen levothyroxine (SYNTHROID, LEVOTHROID) 75 MCG tablet TAKE 1 TABLET BY MOUTH EVERY DAY. NEEDS AN OV.   . Multiple Vitamins-Minerals (MULTIVITAMIN WITH MINERALS) tablet Take 1 tablet by mouth daily.   . valsartan (DIOVAN) 80 MG tablet Take 80 mg by mouth daily.  09/02/2017: Patient is on 40 mg daily  . VYZULTA 0.024 % SOLN INSTILL 1 DROP INTO RIGHT EYE IN THE EVENING    No facility-administered encounter medications on file as of 11/04/2017.     No Known Allergies  Review of Systems  Constitutional: Positive for malaise/fatigue. Negative for fever.  Eyes: Negative.   Respiratory: Negative for cough, shortness of breath and wheezing.   Cardiovascular: Negative for chest pain, palpitations, orthopnea, claudication and leg swelling.  Gastrointestinal: Negative.   Endo/Heme/Allergies: Negative.   Psychiatric/Behavioral: Negative.     Objective:  BP 112/68 (BP Location: Right Arm, Patient Position: Sitting, Cuff Size: Normal)   Pulse 80   Temp 98.1 F (36.7 C) (Oral)   Resp 16   Wt 104 lb (47.2 kg)   BMI 19.02 kg/m   Physical Exam  Constitutional: She is oriented to person, place, and time and well-developed, well-nourished, and in no distress.  HENT:  Head: Normocephalic and atraumatic.  Eyes: Conjunctivae are normal. No scleral icterus.  Neck: No thyromegaly present.  Cardiovascular: Normal rate, regular rhythm and normal heart sounds.  Pulmonary/Chest: Effort  normal and breath sounds normal.  Abdominal: Soft.  Lymphadenopathy:    She has no cervical adenopathy.  Neurological: She is alert and oriented to person, place, and time. Gait normal. GCS score is 15.  Skin: Skin is warm and dry.  Skin lesion on leg.  Psychiatric: Mood, memory, affect and judgment normal.    Assessment and Plan :  MCI CKD HTN Irritated SK Frozen with Cryopen  I have done the exam and reviewed the chart and it is accurate to the best of my knowledge. Development worker, community has been used and  any errors in dictation or transcription are unintentional. Delfino Lovett  Rosanna Randy M.D. [facility]

## 2017-11-04 NOTE — Telephone Encounter (Signed)
Advised  ED 

## 2017-11-09 DIAGNOSIS — R55 Syncope and collapse: Secondary | ICD-10-CM | POA: Diagnosis not present

## 2017-11-17 ENCOUNTER — Telehealth: Payer: Self-pay

## 2017-11-17 NOTE — Telephone Encounter (Signed)
Patient called to ask Dr. Rosanna Randy or his nurse what can she do for headaches? She reports that she has had a persistent headache X 1 week and tylenol has not helped. Contact info is correct. Thanks!

## 2017-11-18 ENCOUNTER — Encounter: Payer: Self-pay | Admitting: Family Medicine

## 2017-11-18 ENCOUNTER — Ambulatory Visit (INDEPENDENT_AMBULATORY_CARE_PROVIDER_SITE_OTHER): Payer: Medicare Other | Admitting: Family Medicine

## 2017-11-18 ENCOUNTER — Ambulatory Visit: Payer: Self-pay | Admitting: Family Medicine

## 2017-11-18 DIAGNOSIS — G4489 Other headache syndrome: Secondary | ICD-10-CM

## 2017-11-18 NOTE — Progress Notes (Signed)
Patient: Erin Ibarra Female    DOB: 1930-03-16   84 y.o.   MRN: [id number] Visit Date: 11/18/2017  Today's Provider: Lavon Paganini, MD   I, Martha Clan, CMA, am acting as scribe for Lavon Paganini, MD.  Chief Complaint  Patient presents with  . Headache   Subjective:    Headache   This is a new problem. Episode onset: x 1 week. The pain is located in the occipital region. The pain quality is not similar to prior headaches. The quality of the pain is described as throbbing. Pain severity now: "high moderate" Pertinent negatives include no abdominal pain, abnormal behavior, anorexia, back pain, blurred vision, dizziness, hearing loss, loss of balance, nausea, neck pain, numbness, phonophobia, photophobia, tingling, visual change, vomiting or weakness. Nothing aggravates the symptoms. She has tried acetaminophen for the symptoms. The treatment provided no relief.   Fluctuating intensity but present for ~1 week.  Had syncopal episode at church 2-3 weeks ago.  She was seen in the emergency department after this episode.  She had CTA neck without vascular compromise and CT chest.  She did not have imaging of her head at that time.  She says nothing seems to make it better or worse as it woke her at rest as well as with movement.  She has never had headaches like this before.  The pain is in her left occipital region without any neck pain or stiffness.  She denies any recent stressors.  Denies any vision changes, speech difficulties, confusion, or other neurologic symptoms.  Of note, however, she did mention that she was late for her appointment earlier today [place] though she has been here many times.     No Known Allergies   Current Outpatient Medications:  .  aspirin 81 MG tablet, Take 81 mg by mouth daily., Disp: , Rfl:  .  brimonidine (ALPHAGAN) 0.2 % ophthalmic solution, , Disp: , Rfl:  .   dorzolamide-timolol (COSOPT) 22.3-6.8 MG/ML ophthalmic solution, USE 1 DROP(S) IN BOTH EYES 2 TIMES A DAY, Disp: , Rfl: 5 .  Fish Oil-Cholecalciferol (FISH OIL + D3) 1000-1000 MG-UNIT CAPS, Take by mouth 2 (two) times daily., Disp: , Rfl:  .  hydrochlorothiazide (HYDRODIURIL) 25 MG tablet, Take 1 tablet (25 mg total) by mouth daily., Disp: 90 tablet, Rfl: 3 .  levothyroxine (SYNTHROID, LEVOTHROID) 75 MCG tablet, TAKE 1 TABLET BY MOUTH EVERY DAY. NEEDS AN OV., Disp: 90 tablet, Rfl: 3 .  Multiple Vitamins-Minerals (MULTIVITAMIN WITH MINERALS) tablet, Take 1 tablet by mouth daily., Disp: , Rfl:  .  valsartan (DIOVAN) 80 MG tablet, Take 80 mg by mouth daily. , Disp: , Rfl: 4 .  VYZULTA 0.024 % SOLN, INSTILL 1 DROP INTO RIGHT EYE IN THE EVENING, Disp: , Rfl: 6 .  Calcium Carbonate-Vit D-Min (CALCIUM 600 + MINERALS PO), Take 1 tablet by mouth daily., Disp: , Rfl:  .  carvedilol (COREG) 25 MG tablet, Take 12.5 mg by mouth 2 (two) times daily. , Disp: , Rfl: 7 .  donepezil (ARICEPT) 5 MG tablet, Take 1 tablet (5 mg total) by mouth at bedtime. (Patient not taking: Reported on 11/18/2017), Disp: 30 tablet, Rfl: 12  Review of Systems  HENT: Negative for hearing loss.   Eyes: Negative for blurred vision and photophobia.  Gastrointestinal: Negative for abdominal pain, anorexia, nausea and vomiting.  Musculoskeletal: Negative for back pain and neck pain.  Neurological:  Positive for headaches. Negative for dizziness, tingling, weakness, numbness and loss of balance.    Social History   Tobacco Use  . Smoking status: Former Smoker    Packs/day: 1.00    Years: 15.00    Pack years: 15.00    Types: Cigarettes  . Smokeless tobacco: Never Used  Substance Use Topics  . Alcohol use: Yes    Alcohol/week: 0.0 oz    Comment: 1 glass of wine qhs   Objective:   BP 110/60 (BP Location: Left Arm, Patient Position: Sitting, Cuff Size: Normal)   Pulse 88   Temp 98.1 F (36.7 C) (Oral)   Resp 16   Wt 107 lb  (48.5 kg)   SpO2 96%   BMI 19.57 kg/m  Vitals:   11/18/17 1353  BP: 110/60  Pulse: 88  Resp: 16  Temp: 98.1 F (36.7 C)  TempSrc: Oral  SpO2: 96%  Weight: 107 lb (48.5 kg)     Physical Exam  Constitutional: She is oriented to person, place, and time. She appears well-developed and well-nourished. She does not appear ill. No distress.  HENT:  Head: Normocephalic and atraumatic.  Mouth/Throat: Oropharynx is clear and moist.  Eyes: Pupils are equal, round, and reactive to light. No scleral icterus. Right eye exhibits no nystagmus. Left eye exhibits no nystagmus.  L eye somewhat limited in rightward gaze. Patient thinks this is chronic  Neck: Neck supple. No JVD present. No neck rigidity.  Cardiovascular: Normal rate, regular rhythm, normal heart sounds and intact distal pulses.  No murmur heard. Pulmonary/Chest: Effort normal and breath sounds normal. No respiratory distress. She has no wheezes.  Musculoskeletal: She exhibits no edema.  Lymphadenopathy:    She has no cervical adenopathy.  Neurological: She is alert and oriented to person, place, and time. She has normal strength. A cranial nerve deficit (cranial nerves intact, except EOM abnormality as noted) is present. No sensory deficit. Coordination and gait normal.  Skin: Skin is warm and dry. Capillary refill takes less than 2 seconds. No rash noted. No pallor.  Psychiatric: She has a normal mood and affect. Her behavior is normal.  Vitals reviewed.      Assessment & Plan:     1. Other headache syndrome - New type L occipital headache in elderly female with recent syncopal episode with possible EOM abnormality (unclear if this is chronic) - ddx includes: Stroke, brain mass, tension headaches (less likely given lack of neck pain or tightness and that this is a new headache type for this elderly female) -We will get stat MRI brain to rule out intracranial abnormalities - Further treatment pending MRI results - MR Brain  Wo Contrast; Future   Return if symptoms worsen or fail to improve.   The entirety of the information documented in the History of Present Illness, Review of Systems and Physical Exam were personally obtained by me. Portions of this information were initially documented by Raquel Sarna Ratchford, CMA and reviewed by me for thoroughness and accuracy.    Virginia Crews, MD, MPH [facility] 11/18/2017 3:39 PM

## 2017-11-18 NOTE — Patient Instructions (Signed)

## 2017-11-18 NOTE — Telephone Encounter (Signed)
Patient to be seen.  Appointment made.

## 2017-11-19 ENCOUNTER — Ambulatory Visit
Admission: RE | Admit: 2017-11-19 | Discharge: 2017-11-19 | Disposition: A | Discharge status: Home / Self Care | Payer: Medicare Other | Source: Ambulatory Visit | Attending: Family Medicine | Admitting: Family Medicine

## 2017-11-19 ENCOUNTER — Telehealth: Payer: Self-pay

## 2017-11-19 DIAGNOSIS — G4489 Other headache syndrome: Secondary | ICD-10-CM | POA: Insufficient documentation

## 2017-11-19 DIAGNOSIS — R51 Headache: Secondary | ICD-10-CM | POA: Diagnosis not present

## 2017-11-19 NOTE — Telephone Encounter (Signed)
Left message advising pt. OK per DPR. 

## 2017-11-19 NOTE — Telephone Encounter (Signed)
-----   Message from Virginia Crews, MD sent at 11/19/2017  2:21 PM EDT ----- Normal MRI brain without abnormalities that could cause headache.  This headache is likely a tension headache.  Recommend tylenol prn, gentle neck ROM stretching and massage of the area.  Virginia Crews, MD, MPH [facility] 11/19/2017 2:21 PM

## 2017-11-26 DIAGNOSIS — I519 Heart disease, unspecified: Secondary | ICD-10-CM | POA: Diagnosis not present

## 2017-11-26 DIAGNOSIS — R55 Syncope and collapse: Secondary | ICD-10-CM | POA: Diagnosis not present

## 2017-11-26 DIAGNOSIS — I10 Essential (primary) hypertension: Secondary | ICD-10-CM | POA: Diagnosis not present

## 2017-11-26 DIAGNOSIS — Q231 Congenital insufficiency of aortic valve: Secondary | ICD-10-CM | POA: Diagnosis not present

## 2017-11-26 DIAGNOSIS — H348122 Central retinal vein occlusion, left eye, stable: Secondary | ICD-10-CM | POA: Diagnosis not present

## 2017-12-09 DIAGNOSIS — H6121 Impacted cerumen, right ear: Secondary | ICD-10-CM | POA: Diagnosis not present

## 2017-12-09 DIAGNOSIS — H903 Sensorineural hearing loss, bilateral: Secondary | ICD-10-CM | POA: Diagnosis not present

## 2018-01-03 DIAGNOSIS — H353111 Nonexudative age-related macular degeneration, right eye, early dry stage: Secondary | ICD-10-CM | POA: Diagnosis not present

## 2018-01-03 DIAGNOSIS — H401113 Primary open-angle glaucoma, right eye, severe stage: Secondary | ICD-10-CM | POA: Diagnosis not present

## 2018-02-16 DIAGNOSIS — M87180 Osteonecrosis due to drugs, jaw: Secondary | ICD-10-CM | POA: Diagnosis not present

## 2018-02-16 DIAGNOSIS — M270 Developmental disorders of jaws: Secondary | ICD-10-CM | POA: Diagnosis not present

## 2018-03-08 ENCOUNTER — Encounter: Payer: Self-pay | Admitting: Family Medicine

## 2018-03-09 ENCOUNTER — Ambulatory Visit: Payer: Self-pay | Admitting: Family Medicine

## 2018-03-10 ENCOUNTER — Ambulatory Visit (INDEPENDENT_AMBULATORY_CARE_PROVIDER_SITE_OTHER): Payer: Medicare Other | Admitting: Family Medicine

## 2018-03-10 ENCOUNTER — Ambulatory Visit (INDEPENDENT_AMBULATORY_CARE_PROVIDER_SITE_OTHER): Payer: Medicare Other

## 2018-03-10 VITALS — BP 134/62 | HR 76 | Temp 98.1°F | Ht 61.0 in | Wt 102.0 lb

## 2018-03-10 VITALS — BP 134/62 | HR 76 | Temp 98.1°F | Ht 61.0 in | Wt 102.2 lb

## 2018-03-10 DIAGNOSIS — N183 Chronic kidney disease, stage 3 unspecified: Secondary | ICD-10-CM

## 2018-03-10 DIAGNOSIS — G3184 Mild cognitive impairment, so stated: Secondary | ICD-10-CM

## 2018-03-10 DIAGNOSIS — E039 Hypothyroidism, unspecified: Secondary | ICD-10-CM

## 2018-03-10 DIAGNOSIS — Z0000 Encounter for general adult medical examination without abnormal findings: Secondary | ICD-10-CM | POA: Diagnosis not present

## 2018-03-10 DIAGNOSIS — I130 Hypertensive heart and chronic kidney disease with heart failure and stage 1 through stage 4 chronic kidney disease, or unspecified chronic kidney disease: Secondary | ICD-10-CM

## 2018-03-10 DIAGNOSIS — Z23 Encounter for immunization: Secondary | ICD-10-CM | POA: Diagnosis not present

## 2018-03-10 DIAGNOSIS — E7800 Pure hypercholesterolemia, unspecified: Secondary | ICD-10-CM

## 2018-03-10 MED ORDER — MEMANTINE HCL 5 MG PO TABS: 5.0000 mg | ORAL_TABLET | Freq: Two times a day (BID) | ORAL | 5 refills | Status: DC

## 2018-03-10 NOTE — Patient Instructions (Addendum)
Erin Ibarra , Thank you for taking time to come for your Medicare Wellness Visit. I appreciate your ongoing commitment to your health goals. Please review the following plan we discussed and let me know if I can assist you in the future.   Screening recommendations/referrals: Colonoscopy: N/A Mammogram: Up to date Bone Density: Up to date Recommended yearly ophthalmology/optometry visit for glaucoma screening and checkup Recommended yearly dental visit for hygiene and checkup  Vaccinations: Influenza vaccine: Up to date Pneumococcal vaccine: Up to date Tdap vaccine: Up to date Shingles vaccine: Pt declines today.     Advanced directives: Please bring a copy of your POA (Power of Attorney) and/or Living Will to your next appointment.   Conditions/risks identified: Recommend to continue increasing water intake to 4 or more glasses of water every day.   Next appointment: 2:00 PM today with Dr Rosanna Randy.    Preventive Care 33 Years and Older, Female Preventive care refers to lifestyle choices and visits with your health care provider that can promote health and wellness. What does preventive care include?  A yearly physical exam. This is also called an annual well check.  Dental exams once or twice a year.  Routine eye exams. Ask your health care provider how often you should have your eyes checked.  Personal lifestyle choices, including:  Daily care of your teeth and gums.  Regular physical activity.  Eating a healthy diet.  Avoiding tobacco and drug use.  Limiting alcohol use.  Practicing safe sex.  Taking low-dose aspirin every day.  Taking vitamin and mineral supplements as recommended by your health care provider. What happens during an annual well check? The services and screenings done by your health care provider during your annual well check will depend on your age, overall health, lifestyle risk factors, and family history of disease. Counseling  Your health  care provider may ask you questions about your:  Alcohol use.  Tobacco use.  Drug use.  Emotional well-being.  Home and relationship well-being.  Sexual activity.  Eating habits.  History of falls.  Memory and ability to understand (cognition).  Work and work Statistician.  Reproductive health. Screening  You may have the following tests or measurements:  Height, weight, and BMI.  Blood pressure.  Lipid and cholesterol levels. These may be checked every 5 years, or more frequently if you are over 18 years old.  Skin check.  Lung cancer screening. You may have this screening every year starting at age 9 if you have a 30-pack-year history of smoking and currently smoke or have quit within the past 15 years.  Fecal occult blood test (FOBT) of the stool. You may have this test every year starting at age 97.  Flexible sigmoidoscopy or colonoscopy. You may have a sigmoidoscopy every 5 years or a colonoscopy every 10 years starting at age 89.  Hepatitis C blood test.  Hepatitis B blood test.  Sexually transmitted disease (STD) testing.  Diabetes screening. This is done by checking your blood sugar (glucose) after you have not eaten for a while (fasting). You may have this done every 1-3 years.  Bone density scan. This is done to screen for osteoporosis. You may have this done starting at age 16.  Mammogram. This may be done every 1-2 years. Talk to your health care provider about how often you should have regular mammograms. Talk with your health care provider about your test results, treatment options, and if necessary, the need for more tests. Vaccines  Your health  care provider may recommend certain vaccines, such as:  Influenza vaccine. This is recommended every year.  Tetanus, diphtheria, and acellular pertussis (Tdap, Td) vaccine. You may need a Td booster every 10 years.  Zoster vaccine. You may need this after age 35.  Pneumococcal 13-valent conjugate  (PCV13) vaccine. One dose is recommended after age 25.  Pneumococcal polysaccharide (PPSV23) vaccine. One dose is recommended after age 42. Talk to your health care provider about which screenings and vaccines you need and how often you need them. This information is not intended to replace advice given to you by your health care provider. Make sure you discuss any questions you have with your health care provider. Document Released: 07/19/2015 Document Revised: 03/11/2016 Document Reviewed: 04/23/2015 Elsevier Interactive Patient Education  2017 [place] Prevention in the Home Falls can cause injuries. They can happen to people of all ages. There are many things you can do to make your home safe and to help prevent falls. What can I do on the outside of my home?  Regularly fix the edges of walkways and driveways and fix any cracks.  Remove anything that might make you trip as you walk through a door, such as a raised step or threshold.  Trim any bushes or trees on the path to your home.  Use bright outdoor lighting.  Clear any walking paths of anything that might make someone trip, such as rocks or tools.  Regularly check to see if handrails are loose or broken. Make sure that both sides of any steps have handrails.  Any raised decks and porches should have guardrails on the edges.  Have any leaves, snow, or ice cleared regularly.  Use sand or salt on walking paths during winter.  Clean up any spills in your garage right away. This includes oil or grease spills. What can I do in the bathroom?  Use night lights.  Install grab bars by the toilet and in the tub and shower. Do not use towel bars as grab bars.  Use non-skid mats or decals in the tub or shower.  If you need to sit down in the shower, use a plastic, non-slip stool.  Keep the floor dry. Clean up any water that spills on the floor as soon as it happens.  Remove soap buildup in the tub or shower  regularly.  Attach bath mats securely with double-sided non-slip rug tape.  Do not have throw rugs and other things on the floor that can make you trip. What can I do in the bedroom?  Use night lights.  Make sure that you have a light by your bed that is easy to reach.  Do not use any sheets or blankets that are too big for your bed. They should not hang down onto the floor.  Have a firm chair that has side arms. You can use this for support while you get dressed.  Do not have throw rugs and other things on the floor that can make you trip. What can I do in the kitchen?  Clean up any spills right away.  Avoid walking on wet floors.  Keep items that you use a lot in easy-to-reach places.  If you need to reach something above you, use a strong step stool that has a grab bar.  Keep electrical cords out of the way.  Do not use floor polish or wax that makes floors slippery. If you must use wax, use non-skid floor wax.  Do not have  throw rugs and other things on the floor that can make you trip. What can I do with my stairs?  Do not leave any items on the stairs.  Make sure that there are handrails on both sides of the stairs and use them. Fix handrails that are broken or loose. Make sure that handrails are as long as the stairways.  Check any carpeting to make sure that it is firmly attached to the stairs. Fix any carpet that is loose or worn.  Avoid having throw rugs at the top or bottom of the stairs. If you do have throw rugs, attach them to the floor with carpet tape.  Make sure that you have a light switch at the top of the stairs and the bottom of the stairs. If you do not have them, ask someone to add them for you. What else can I do to help prevent falls?  Wear shoes that:  Do not have high heels.  Have rubber bottoms.  Are comfortable and fit you well.  Are closed at the toe. Do not wear sandals.  If you use a stepladder:  Make sure that it is fully  opened. Do not climb a closed stepladder.  Make sure that both sides of the stepladder are locked into place.  Ask someone to hold it for you, if possible.  Clearly mark and make sure that you can see:  Any grab bars or handrails.  First and last steps.  Where the edge of each step is.  Use tools that help you move around (mobility aids) if they are needed. These include:  Canes.  Walkers.  Scooters.  Crutches.  Turn on the lights when you go into a dark area. Replace any light bulbs as soon as they burn out.  Set up your furniture so you have a clear path. Avoid moving your furniture around.  If any of your floors are uneven, fix them.  If there are any pets around you, be aware of where they are.  Review your medicines with your doctor. Some medicines can make you feel dizzy. This can increase your chance of falling. Ask your doctor what other things that you can do to help prevent falls. This information is not intended to replace advice given to you by your health care provider. Make sure you discuss any questions you have with your health care provider. Document Released: 04/18/2009 Document Revised: 11/28/2015 Document Reviewed: 07/27/2014 Elsevier Interactive Patient Education  2017 [employer].

## 2018-03-10 NOTE — Progress Notes (Signed)
Subjective:   Erin Ibarra is a 84 y.o. female who presents for Medicare Annual (Subsequent) preventive examination.  Review of Systems:  N/A  Cardiac Risk Factors include: advanced age (>28men, >52 women);dyslipidemia;hypertension     Objective:     Vitals: BP 134/62 (BP Location: Right Arm)   Pulse 76   Temp 98.1 F (36.7 C) (Oral)   Ht 5\' 1"  (1.549 m)   Wt 102 lb 3.2 oz (46.4 kg)   BMI 19.31 kg/m   Body mass index is 19.31 kg/m.  Advanced Directives 03/10/2018 10/24/2017 07/13/2017 03/04/2017 09/24/2016 09/09/2015  Does Patient Have a Medical Advance Directive? Yes Yes Yes Yes Yes Yes  Type of Advance Directive Living will;Healthcare Power of [place];Living will [place];Living will [place];Living will Living will;Healthcare Power of Attorney  Does patient want to make changes to medical advance directive? - - No - Patient declined - - -  Copy of [place] in Chart? No - copy requested No - copy requested No - copy requested No - copy requested No - copy requested -    Tobacco Social History   Tobacco Use  Smoking Status Former Smoker  . Packs/day: 1.00  . Years: 15.00  . Pack years: 15.00  . Types: Cigarettes  Smokeless Tobacco Never Used  Tobacco Comment   > 15 years ago     Counseling given: Not Answered Comment: > 15 years ago   Clinical Intake:  Pre-visit preparation completed: Yes  Pain : No/denies pain Pain Score: 0-No pain     Nutritional Status: BMI of 19-24  Normal Nutritional Risks: None Diabetes: No  How often do you need to have someone help you when you read instructions, pamphlets, or other written materials from your doctor or pharmacy?: 1 - Never  Interpreter Needed?: No  Information entered by :: [facility], LPN  Past Medical History:  Diagnosis Date  . Bladder cancer ([place]) 1991   Hx TUR and chemo tx's.   .  Chronic kidney disease   . Glaucoma   . Heart murmur   . Hypertension   . Renal insufficiency    Hx Stage 3 kidney disease.  . Thyroid disease    Past Surgical History:  Procedure Laterality Date  . broke knee cap    . CATARACT EXTRACTION  2015  . COLONOSCOPY  2006  . TRANSURETHRAL RESECTION OF BLADDER  1991   Family History  Problem Relation Age of Onset  . Prostate cancer Father   . Cancer Father        prostate  . Heart disease Father        MI  . Pancreatic cancer Brother   . Lung cancer Brother   . Cancer Brother        brain, lung  . Heart disease Brother        MI   Social History   Socioeconomic History  . Marital status: Widowed    Spouse name: Not on file  . Number of children: 4  . Years of education: Not on file  . Highest education level: Bachelor's degree (e.g., BA, AB, BS)  Occupational History  . Occupation: retired  Scientific laboratory technician  . Financial resource strain: Not hard at all  . Food insecurity:    Worry: Never true    Inability: Never true  . Transportation needs:    Medical: No    Non-medical: No  Tobacco Use  . Smoking status: Former Smoker    Packs/day: 1.00    Years: 15.00    Pack years: 15.00    Types: Cigarettes  . Smokeless tobacco: Never Used  . Tobacco comment: > 15 years ago  Substance and Sexual Activity  . Alcohol use: Yes    Alcohol/week: 7.0 standard drinks    Types: 7 Glasses of wine per week    Comment: 1 glass of wine qhs  . Drug use: No  . Sexual activity: Not on file  Lifestyle  . Physical activity:    Days per week: Not on file    Minutes per session: Not on file  . Stress: Not at all  Relationships  . Social connections:    Talks on phone: Not on file    Gets together: Not on file    Attends religious service: Not on file    Active member of club or organization: Not on file    Attends meetings of clubs or organizations: Not on file    Relationship status: Not on file  Other Topics Concern  . Not on file    Social History Narrative  . Not on file    Outpatient Encounter Medications as of 03/10/2018  Medication Sig  . aspirin 81 MG tablet Take 81 mg by mouth daily.  . brimonidine (ALPHAGAN) 0.2 % ophthalmic solution Place 1 drop into the right eye 2 (two) times daily.   . Calcium Carbonate-Vit D-Min (CALCIUM 600 + MINERALS PO) Take 1 tablet by mouth daily.  . carvedilol (COREG) 25 MG tablet Take 12.5 mg by mouth 2 (two) times daily.   . dorzolamide-timolol (COSOPT) 22.3-6.8 MG/ML ophthalmic solution USE 1 DROP(S) IN BOTH EYES 2 TIMES A DAY  . Fish Oil-Cholecalciferol (FISH OIL + D3) 1000-1000 MG-UNIT CAPS Take by mouth 2 (two) times daily.  . hydrochlorothiazide (HYDRODIURIL) 25 MG tablet Take 1 tablet (25 mg total) by mouth daily.  Marland Kitchen levothyroxine (SYNTHROID, LEVOTHROID) 75 MCG tablet TAKE 1 TABLET BY MOUTH EVERY DAY. NEEDS AN OV.  . Multiple Vitamins-Minerals (MULTIVITAMIN WITH MINERALS) tablet Take 1 tablet by mouth daily.  . valsartan (DIOVAN) 80 MG tablet Take 80 mg by mouth daily.   Marland Kitchen VYZULTA 0.024 % SOLN INSTILL 1 DROP INTO RIGHT EYE IN THE EVENING  . donepezil (ARICEPT) 5 MG tablet Take 1 tablet (5 mg total) by mouth at bedtime. (Patient not taking: Reported on 11/18/2017)   No facility-administered encounter medications on file as of 03/10/2018.     Activities of Daily Living In your present state of health, do you have any difficulty performing the following activities: 03/10/2018  Hearing? Y  Comment Wears bilateral hearing aids.   Vision? Y  Comment Due to vision loss and glaucoma.  Difficulty concentrating or making decisions? Y  Walking or climbing stairs? Y  Comment Due to SOB.  Dressing or bathing? N  Doing errands, shopping? N  Preparing Food and eating ? N  Using the Toilet? N  In the past six months, have you accidently leaked urine? N  Do you have problems with loss of bowel control? N  Managing your Medications? N  Managing your Finances? N  Housekeeping or managing  your Housekeeping? N  Some recent data might be hidden    Patient Care Team: Jerrol Banana., MD as PCP - General (Family Medicine) Murlean Iba, MD as Consulting Physician (Internal Medicine) Leandrew Koyanagi, MD as Referring Physician (Ophthalmology) Isaias Cowman, MD as Consulting Physician (  Cardiology) Kipp Laurence, MD as Referring Physician (Audiology)    Assessment:   This is a routine wellness examination for [place].  Exercise Activities and Dietary recommendations Current Exercise Habits: The patient does not participate in regular exercise at present, Exercise limited by: None identified  Goals    . Increase water intake     Recommend increasing water intake to 4-6 glasses a day.        Fall Risk Fall Risk  03/10/2018 03/04/2017 01/29/2016  Falls in the past year? Yes Yes Yes  Number falls in past yr: 2 or more 2 or more 1  Injury with Fall? Yes No No  Comment broke the left knee cap bruising -  Risk for fall due to : Impaired balance/gait;Impaired vision - -  Follow up Falls prevention discussed Falls prevention discussed -   Is the patient's home free of loose throw rugs in walkways, pet beds, electrical cords, etc?   yes      Grab bars in the bathroom? yes      Handrails on the stairs?   yes      Adequate lighting?   yes  Timed Get Up and Go performed: N/A  Depression Screen PHQ 2/9 Scores 03/10/2018 03/04/2017 03/04/2017 01/29/2016  PHQ - 2 Score 0 1 1 0  PHQ- 9 Score 4 3 - -     Cognitive Function MMSE - Mini Mental State Exam 09/02/2017  Orientation to time 5  Orientation to Place 5  Registration 3  Attention/ Calculation 5  Recall 3  Language- name 2 objects 2  Language- repeat 1  Language- follow 3 step command 3  Language- read & follow direction 1  Write a sentence 1  Copy design 1  Total score 30     6CIT Screen 03/04/2017  What Year? 0 points  What month? 0 points  What time? 0 points  Count back from 20 0 points    Months in reverse 0 points  Repeat phrase 8 points  Total Score 8    Immunization History  Administered Date(s) Administered  . H1N1 07/02/2008  . Influenza Split 07/19/2009  . Influenza, High Dose Seasonal PF 04/06/2014, 04/06/2015, 03/04/2017, 03/10/2018  . Influenza,inj,Quad PF,6+ Mos 03/29/2013  . Pneumococcal Conjugate-13 04/06/2014  . Pneumococcal Polysaccharide-23 04/30/1997  . Td 03/06/2004, 08/04/2013  . Tdap 08/04/2013  . Zoster 09/20/2008    Qualifies for Shingles Vaccine? Due for Shingles vaccine. Declined my offer to administer today. Education has been provided regarding the importance of this vaccine. Pt has been advised to call her insurance company to determine her out of pocket expense. Advised she may also receive this vaccine at her local pharmacy or Health Dept. Verbalized acceptance and understanding.  Screening Tests Health Maintenance  Topic Date Due  . TETANUS/TDAP  08/05/2023  . INFLUENZA VACCINE  Completed  . DEXA SCAN  Completed  . PNA vac Low Risk Adult  Completed    Cancer Screenings: Lung: Low Dose CT Chest recommended if Age 30-80 years, 30 pack-year currently smoking OR have quit w/in 15years. Patient does not qualify. Breast:  Up to date on Mammogram? Yes   Up to date of Bone Density/Dexa? Yes Colorectal: N/A  Additional Screenings:  Hepatitis C Screening: N/A     Plan:  I have personally reviewed and addressed the Medicare Annual Wellness questionnaire and have noted the following in the patient's chart:  A. Medical and social history B. Use of alcohol, tobacco or illicit drugs  C.  Current medications and supplements D. Functional ability and status E.  Nutritional status F.  Physical activity G. Advance directives H. List of other physicians I.  Hospitalizations, surgeries, and ER visits in previous 12 months J.  [place] such as hearing and vision if needed, cognitive and depression L. Referrals and appointments -  none  In addition, I have reviewed and discussed with patient certain preventive protocols, quality metrics, and best practice recommendations. A written personalized care plan for preventive services as well as general preventive health recommendations were provided to patient.  See attached scanned questionnaire for additional information.   Signed,  Fabio Neighbors, LPN Nurse Health Advisor   Nurse Recommendations: None.

## 2018-03-10 NOTE — Progress Notes (Signed)
Patient: Erin Ibarra, Female    DOB: Oct 09, 1929, 84 y.o.   MRN: [id number] Visit Date: 03/10/2018  Today's Provider: Wilhemena Durie, MD   Chief Complaint  Patient presents with  . Annual Exam   Subjective:  Erin Ibarra is a 84 y.o. female who presents today for health maintenance and complete physical. She feels well. She reports exercising . She reports she is sleeping well She live at [facility] and wishes no further screening testing. She feels fairly well..  Immunization History  Administered Date(s) Administered  . H1N1 07/02/2008  . Influenza Split 07/19/2009  . Influenza, High Dose Seasonal PF 04/06/2014, 04/06/2015, 03/04/2017, 03/10/2018  . Influenza,inj,Quad PF,6+ Mos 03/29/2013  . Pneumococcal Conjugate-13 04/06/2014  . Pneumococcal Polysaccharide-23 04/30/1997  . Td 03/06/2004, 08/04/2013  . Tdap 08/04/2013  . Zoster 09/20/2008   05/01/2004 Colonoscopy, [place]-Hyperplastic polyp 06/15/2007 Pap smear-Negative 11/03/16 BMD-Osteopenia 04/26/14 Mammogram-Negative   Review of Systems  Constitutional: Negative.   HENT: Negative.   Eyes: Negative.   Respiratory: Negative.   Cardiovascular: Negative.   Gastrointestinal: Negative.   Endocrine: Negative.   Genitourinary: Negative.   Musculoskeletal: Negative.   Skin: Negative.   Allergic/Immunologic: Negative.   Neurological: Negative.   Hematological: Negative.   Psychiatric/Behavioral: Negative.     Social History   Socioeconomic History  . Marital status: Widowed    Spouse name: Not on file  . Number of children: 4  . Years of education: Not on file  . Highest education level: Bachelor's degree (e.g., BA, AB, BS)  Occupational History  . Occupation: retired  Scientific laboratory technician  . Financial resource strain: Not hard at all  . Food insecurity:    Worry: Never true    Inability: Never true  . Transportation needs:    Medical: No    Non-medical: No  Tobacco Use  . Smoking status: Former  Smoker    Packs/day: 1.00    Years: 15.00    Pack years: 15.00    Types: Cigarettes  . Smokeless tobacco: Never Used  . Tobacco comment: > 15 years ago  Substance and Sexual Activity  . Alcohol use: Yes    Alcohol/week: 7.0 standard drinks    Types: 7 Glasses of wine per week    Comment: 1 glass of wine qhs  . Drug use: No  . Sexual activity: Not on file  Lifestyle  . Physical activity:    Days per week: Not on file    Minutes per session: Not on file  . Stress: Not at all  Relationships  . Social connections:    Talks on phone: Not on file    Gets together: Not on file    Attends religious service: Not on file    Active member of club or organization: Not on file    Attends meetings of clubs or organizations: Not on file    Relationship status: Not on file  . Intimate partner violence:    Fear of current or ex partner: Not on file    Emotionally abused: Not on file    Physically abused: Not on file    Forced sexual activity: Not on file  Other Topics Concern  . Not on file  Social History Narrative  . Not on file    Patient Active Problem List   Diagnosis Date Noted  . Benign hypertensive heart and CKD, stage 3 (GFR 30-59), w CHF ([place]) 09/09/2015  . Hyperkalemia 09/09/2015  . Amblyopia 09/06/2015  . Back ache 09/06/2015  .  Aortic valve, bicuspid 09/06/2015  . Bladder cancer ([place]) 09/06/2015  . Colon polyp 09/06/2015  . DD (diverticular disease) 09/06/2015  . Breathlessness on exertion 09/06/2015  . Hypercholesteremia 09/06/2015  . Below normal amount of sodium in the blood 09/06/2015  . Cervical pain 09/06/2015  . Amnesia 09/06/2015  . Post menopausal syndrome 09/06/2015  . Disorder of scalp 09/06/2015  . Hypo-osmolality and hyponatremia 09/06/2015  . Diverticulitis 09/06/2015  . Aortic regurgitation, congenital 09/06/2015  . OP (osteoporosis) 05/20/2015  . Age-related osteoporosis without current pathological fracture 05/20/2015  . Hypothyroidism  12/26/2014  . Encounter for screening colonoscopy 08/29/2014  . BP (high blood pressure) 02/03/2013  . Difficulty hearing 02/03/2013  . Cataract 10/14/2012  . Central retinal vein occlusion 10/14/2012  . 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 Cardiac failure ([place]) 06/21/2008  . Heart failure ([place]) 06/21/2008    Past Surgical History:  Procedure Laterality Date  . broke knee cap    . CATARACT EXTRACTION  2015  . COLONOSCOPY  2006  . TRANSURETHRAL RESECTION OF BLADDER  1991    Her family history includes Cancer in her brother and father; Heart disease in her brother and father; Lung cancer in her brother; Pancreatic cancer in her brother; Prostate cancer in her father.     Outpatient Encounter Medications as of 03/10/2018  Medication Sig Note  . aspirin 81 MG tablet Take 81 mg by mouth daily.   . brimonidine (ALPHAGAN) 0.2 % ophthalmic solution Place 1 drop into the right eye 2 (two) times daily.    . Calcium Carbonate-Vit D-Min (CALCIUM 600 + MINERALS PO) Take 1 tablet by mouth daily.   . carvedilol (COREG) 25 MG tablet Take 12.5 mg by mouth 2 (two) times daily.    Marland Kitchen donepezil (ARICEPT) 5 MG tablet Take 1 tablet (5 mg total) by mouth at bedtime. (Patient not taking: Reported on 11/18/2017) 11/18/2017: Pt states this medication caused "vivid dreams".  . dorzolamide-timolol (COSOPT) 22.3-6.8 MG/ML ophthalmic solution USE 1 DROP(S) IN BOTH EYES 2 TIMES A DAY   . Fish Oil-Cholecalciferol (FISH OIL + D3) 1000-1000 MG-UNIT CAPS Take by mouth 2 (two) times daily.   . hydrochlorothiazide (HYDRODIURIL) 25 MG tablet Take 1 tablet (25 mg total) by mouth daily.   Marland Kitchen levothyroxine (SYNTHROID, LEVOTHROID) 75 MCG tablet TAKE 1 TABLET BY MOUTH EVERY DAY. NEEDS AN OV.   . Multiple Vitamins-Minerals (MULTIVITAMIN WITH MINERALS) tablet Take 1 tablet by mouth daily.   . valsartan (DIOVAN) 80 MG tablet Take 80 mg by mouth daily.  09/02/2017: Patient is on 40 mg daily  . VYZULTA 0.024 % SOLN INSTILL 1 DROP INTO  RIGHT EYE IN THE EVENING    No facility-administered encounter medications on file as of 03/10/2018.     Patient Care Team: Jerrol Banana., MD as PCP - General (Family Medicine) Murlean Iba, MD as Consulting Physician (Internal Medicine) Leandrew Koyanagi, MD as Referring Physician (Ophthalmology) Isaias Cowman, MD as Consulting Physician (Cardiology) Kipp Laurence, MD as Referring Physician (Audiology)      Objective:   Vitals:  Vitals:   03/10/18 1400  BP: 134/62  Pulse: 76  Temp: 98.1 F (36.7 C)  TempSrc: Oral  Weight: 102 lb (46.3 kg)  Height: 5\' 1"  (1.549 m)     Physical Exam  Constitutional: She is oriented to person, place, and time. She appears well-developed.  Cachectic WF NAD  HENT:  Head: Normocephalic and atraumatic.  Right Ear: External ear normal.  Left Ear: External ear normal.  Nose: Nose normal.  Mouth/Throat: Oropharynx is clear and moist.  Eyes: Pupils are equal, round, and reactive to light. Conjunctivae and EOM are normal.  Neck: Normal range of motion. Neck supple.  Cardiovascular: Normal rate, regular rhythm, normal heart sounds and intact distal pulses.  Pulmonary/Chest: Effort normal and breath sounds normal.  Abdominal: Soft. Bowel sounds are normal.  Musculoskeletal: Normal range of motion.  Neurological: She is alert and oriented to person, place, and time.  Skin: Skin is warm and dry.  Psychiatric: She has a normal mood and affect. Her behavior is normal. Judgment and thought content normal.     Depression Screen PHQ 2/9 Scores 03/10/2018 03/04/2017 03/04/2017 01/29/2016  PHQ - 2 Score 0 1 1 0  PHQ- 9 Score 4 3 - -      Assessment & Plan:     Routine Health Maintenance and Physical Exam  Exercise Activities and Dietary recommendations Goals    . Increase water intake     Recommend increasing water intake to 4-6 glasses a day.        Immunization History  Administered Date(s) Administered  . H1N1  07/02/2008  . Influenza Split 07/19/2009  . Influenza, High Dose Seasonal PF 04/06/2014, 04/06/2015, 03/04/2017, 03/10/2018  . Influenza,inj,Quad PF,6+ Mos 03/29/2013  . Pneumococcal Conjugate-13 04/06/2014  . Pneumococcal Polysaccharide-23 04/30/1997  . Td 03/06/2004, 08/04/2013  . Tdap 08/04/2013  . Zoster 09/20/2008    Health Maintenance  Topic Date Due  . TETANUS/TDAP  08/05/2023  . INFLUENZA VACCINE  Completed  . DEXA SCAN  Completed  . PNA vac Low Risk Adult  Completed   1. Benign hypertensive heart and CKD, stage 3 (GFR 30-59), w CHF ([place])  - CBC with Differential/Platelet - Comprehensive metabolic panel  2. Hypothyroidism, unspecified type  - TSH  3. Hypercholesteremia  - Lipid Panel With LDL/HDL Ratio  4. Annual physical exam 5.MCI Pt had vivid dreams with aricept. Try Namenda 5mg  BID to slow process down. Discussed natural history with pt.     Discussed health benefits of physical activity, and encouraged her to engage in regular exercise appropriate for her age and condition.   I have done the exam and reviewed the chart and it is accurate to the best of my knowledge. Development worker, community has been used and  any errors in dictation or transcription are unintentional. Miguel Aschoff M.D. [facility]

## 2018-03-16 DIAGNOSIS — E7800 Pure hypercholesterolemia, unspecified: Secondary | ICD-10-CM | POA: Diagnosis not present

## 2018-03-16 DIAGNOSIS — I130 Hypertensive heart and chronic kidney disease with heart failure and stage 1 through stage 4 chronic kidney disease, or unspecified chronic kidney disease: Secondary | ICD-10-CM | POA: Diagnosis not present

## 2018-03-16 DIAGNOSIS — E039 Hypothyroidism, unspecified: Secondary | ICD-10-CM | POA: Diagnosis not present

## 2018-03-16 DIAGNOSIS — N183 Chronic kidney disease, stage 3 (moderate): Secondary | ICD-10-CM | POA: Diagnosis not present

## 2018-03-17 LAB — COMPREHENSIVE METABOLIC PANEL
ALBUMIN: 4.2 g/dL (ref 3.5–4.7)
ALK PHOS: 61 IU/L (ref 39–117)
ALT: 11 IU/L (ref 0–32)
AST: 18 IU/L (ref 0–40)
Albumin/Globulin Ratio: 1.7 (ref 1.2–2.2)
BUN/Creatinine Ratio: 27 (ref 12–28)
BUN: 40 mg/dL — AB (ref 8–27)
Bilirubin Total: 0.4 mg/dL (ref 0.0–1.2)
CO2: 27 mmol/L (ref 20–29)
CREATININE: 1.47 mg/dL — AB (ref 0.57–1.00)
Calcium: 9.3 mg/dL (ref 8.7–10.3)
Chloride: 91 mmol/L — ABNORMAL LOW (ref 96–106)
GFR calc Af Amer: 37 mL/min/{1.73_m2} — ABNORMAL LOW (ref >59)
GFR calc non Af Amer: 32 mL/min/{1.73_m2} — ABNORMAL LOW (ref >59)
GLUCOSE: 95 mg/dL (ref 65–99)
Globulin, Total: 2.5 g/dL (ref 1.5–4.5)
Potassium: 4.8 mmol/L (ref 3.5–5.2)
Sodium: 134 mmol/L (ref 134–144)
Total Protein: 6.7 g/dL (ref 6.0–8.5)

## 2018-03-17 LAB — LIPID PANEL WITH LDL/HDL RATIO
CHOLESTEROL TOTAL: 193 mg/dL (ref 100–199)
HDL: 74 mg/dL (ref >39)
LDL CALC: 105 mg/dL — AB (ref 0–99)
LDL/HDL RATIO: 1.4 ratio (ref 0.0–3.2)
Triglycerides: 70 mg/dL (ref 0–149)
VLDL CHOLESTEROL CAL: 14 mg/dL (ref 5–40)

## 2018-03-17 LAB — CBC WITH DIFFERENTIAL/PLATELET
Basophils Absolute: 0.1 10*3/uL (ref 0.0–0.2)
Basos: 1 %
EOS (ABSOLUTE): 0.2 10*3/uL (ref 0.0–0.4)
Eos: 3 %
HEMATOCRIT: 38.6 % (ref 34.0–46.6)
HEMOGLOBIN: 13.2 g/dL (ref 11.1–15.9)
IMMATURE GRANULOCYTES: 0 %
Immature Grans (Abs): 0 10*3/uL (ref 0.0–0.1)
LYMPHS ABS: 2.3 10*3/uL (ref 0.7–3.1)
Lymphs: 30 %
MCH: 32.1 pg (ref 26.6–33.0)
MCHC: 34.2 g/dL (ref 31.5–35.7)
MCV: 94 fL (ref 79–97)
MONOCYTES: 8 %
Monocytes Absolute: 0.6 10*3/uL (ref 0.1–0.9)
Neutrophils Absolute: 4.5 10*3/uL (ref 1.4–7.0)
Neutrophils: 58 %
Platelets: 296 10*3/uL (ref 150–450)
RBC: 4.11 x10E6/uL (ref 3.77–5.28)
RDW: 12 % — ABNORMAL LOW (ref 12.3–15.4)
WBC: 7.7 10*3/uL (ref 3.4–10.8)

## 2018-03-17 LAB — TSH: TSH: 4.83 u[IU]/mL — ABNORMAL HIGH (ref 0.450–4.500)

## 2018-03-31 DIAGNOSIS — I10 Essential (primary) hypertension: Secondary | ICD-10-CM | POA: Diagnosis not present

## 2018-03-31 DIAGNOSIS — H348122 Central retinal vein occlusion, left eye, stable: Secondary | ICD-10-CM | POA: Diagnosis not present

## 2018-03-31 DIAGNOSIS — I519 Heart disease, unspecified: Secondary | ICD-10-CM | POA: Diagnosis not present

## 2018-03-31 DIAGNOSIS — I5022 Chronic systolic (congestive) heart failure: Secondary | ICD-10-CM | POA: Diagnosis not present

## 2018-03-31 DIAGNOSIS — R55 Syncope and collapse: Secondary | ICD-10-CM | POA: Diagnosis not present

## 2018-04-10 ENCOUNTER — Other Ambulatory Visit: Payer: Self-pay | Admitting: Family Medicine

## 2018-04-26 DIAGNOSIS — N183 Chronic kidney disease, stage 3 (moderate): Secondary | ICD-10-CM | POA: Diagnosis not present

## 2018-04-26 DIAGNOSIS — E875 Hyperkalemia: Secondary | ICD-10-CM | POA: Diagnosis not present

## 2018-04-26 DIAGNOSIS — I129 Hypertensive chronic kidney disease with stage 1 through stage 4 chronic kidney disease, or unspecified chronic kidney disease: Secondary | ICD-10-CM | POA: Diagnosis not present

## 2018-05-02 DIAGNOSIS — H401113 Primary open-angle glaucoma, right eye, severe stage: Secondary | ICD-10-CM | POA: Diagnosis not present

## 2018-05-09 DIAGNOSIS — H401113 Primary open-angle glaucoma, right eye, severe stage: Secondary | ICD-10-CM | POA: Diagnosis not present

## 2018-05-16 DIAGNOSIS — H4052X3 Glaucoma secondary to other eye disorders, left eye, severe stage: Secondary | ICD-10-CM | POA: Diagnosis not present

## 2018-05-30 DIAGNOSIS — H4052X3 Glaucoma secondary to other eye disorders, left eye, severe stage: Secondary | ICD-10-CM | POA: Diagnosis not present

## 2018-06-09 ENCOUNTER — Ambulatory Visit: Payer: Self-pay | Admitting: Family Medicine

## 2018-06-16 ENCOUNTER — Ambulatory Visit (INDEPENDENT_AMBULATORY_CARE_PROVIDER_SITE_OTHER): Payer: Medicare Other | Admitting: Family Medicine

## 2018-06-16 VITALS — BP 138/70 | HR 70 | Temp 98.6°F | Resp 16 | Wt 103.0 lb

## 2018-06-16 DIAGNOSIS — I130 Hypertensive heart and chronic kidney disease with heart failure and stage 1 through stage 4 chronic kidney disease, or unspecified chronic kidney disease: Secondary | ICD-10-CM | POA: Diagnosis not present

## 2018-06-16 DIAGNOSIS — M879 Osteonecrosis, unspecified: Secondary | ICD-10-CM

## 2018-06-16 DIAGNOSIS — N183 Chronic kidney disease, stage 3 (moderate): Secondary | ICD-10-CM

## 2018-06-16 DIAGNOSIS — E039 Hypothyroidism, unspecified: Secondary | ICD-10-CM

## 2018-06-16 DIAGNOSIS — G3184 Mild cognitive impairment, so stated: Secondary | ICD-10-CM

## 2018-06-16 DIAGNOSIS — M810 Age-related osteoporosis without current pathological fracture: Secondary | ICD-10-CM

## 2018-06-16 NOTE — Progress Notes (Signed)
Erin Ibarra  MRN: [id number] DOB: 1929/10/02  Subjective:  HPI  The patient is an 84 year old female who presents today for 3 month follow up.  She was last seen on 03/10/18 for her annual exam.   The patient was started on Namenda on her last visit and instructed to return today for re-evaluation.  She had been having vivd dreams on the Aricept.  The patient got confused about her medicine and now she is not taking anything for memory.  Patient Active Problem List   Diagnosis Date Noted  . Benign hypertensive heart and CKD, stage 3 (GFR 30-59), w CHF ([place]) 09/09/2015  . Hyperkalemia 09/09/2015  . Amblyopia 09/06/2015  . Back ache 09/06/2015  . Aortic valve, bicuspid 09/06/2015  . Bladder cancer ([place]) 09/06/2015  . Colon polyp 09/06/2015  . DD (diverticular disease) 09/06/2015  . Breathlessness on exertion 09/06/2015  . Hypercholesteremia 09/06/2015  . Below normal amount of sodium in the blood 09/06/2015  . Cervical pain 09/06/2015  . Amnesia 09/06/2015  . Post menopausal syndrome 09/06/2015  . Disorder of scalp 09/06/2015  . Hypo-osmolality and hyponatremia 09/06/2015  . Diverticulitis 09/06/2015  . Aortic regurgitation, congenital 09/06/2015  . OP (osteoporosis) 05/20/2015  . Age-related osteoporosis without current pathological fracture 05/20/2015  . Hypothyroidism 12/26/2014  . Encounter for screening colonoscopy 08/29/2014  . BP (high blood pressure) 02/03/2013  . Difficulty hearing 02/03/2013  . Cataract 10/14/2012  . Central retinal vein occlusion 10/14/2012  . 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 Cardiac failure ([place]) 06/21/2008  . Heart failure ([place]) 06/21/2008    Past Medical History:  Diagnosis Date  . Bladder cancer ([place]) 1991   Hx TUR and chemo tx's.   . Chronic kidney disease   . Glaucoma   . Heart murmur   . Hypertension   . Renal insufficiency    Hx Stage 3 kidney disease.  . Thyroid disease     Social History   Socioeconomic History    . Marital status: Widowed    Spouse name: Not on file  . Number of children: 4  . Years of education: Not on file  . Highest education level: Bachelor's degree (e.g., BA, AB, BS)  Occupational History  . Occupation: retired  Scientific laboratory technician  . Financial resource strain: Not hard at all  . Food insecurity:    Worry: Never true    Inability: Never true  . Transportation needs:    Medical: No    Non-medical: No  Tobacco Use  . Smoking status: Former Smoker    Packs/day: 1.00    Years: 15.00    Pack years: 15.00    Types: Cigarettes  . Smokeless tobacco: Never Used  . Tobacco comment: > 15 years ago  Substance and Sexual Activity  . Alcohol use: Yes    Alcohol/week: 7.0 standard drinks    Types: 7 Glasses of wine per week    Comment: 1 glass of wine qhs  . Drug use: No  . Sexual activity: Not on file  Lifestyle  . Physical activity:    Days per week: Not on file    Minutes per session: Not on file  . Stress: Not at all  Relationships  . Social connections:    Talks on phone: Not on file    Gets together: Not on file    Attends religious service: Not on file    Active member of club or organization: Not on file    Attends meetings of  clubs or organizations: Not on file    Relationship status: Not on file  . Intimate partner violence:    Fear of current or ex partner: Not on file    Emotionally abused: Not on file    Physically abused: Not on file    Forced sexual activity: Not on file  Other Topics Concern  . Not on file  Social History Narrative  . Not on file    Outpatient Encounter Medications as of 06/16/2018  Medication Sig Note  . aspirin 81 MG tablet Take 81 mg by mouth daily.   . brimonidine (ALPHAGAN) 0.2 % ophthalmic solution Place 1 drop into the right eye 2 (two) times daily.    . Calcium Carbonate-Vit D-Min (CALCIUM 600 + MINERALS PO) Take 1 tablet by mouth daily.   . carvedilol (COREG) 25 MG tablet Take 12.5 mg by mouth 2 (two) times daily.    .  dorzolamide-timolol (COSOPT) 22.3-6.8 MG/ML ophthalmic solution USE 1 DROP(S) IN BOTH EYES 2 TIMES A DAY   . Fish Oil-Cholecalciferol (FISH OIL + D3) 1000-1000 MG-UNIT CAPS Take by mouth 2 (two) times daily.   . hydrochlorothiazide (HYDRODIURIL) 25 MG tablet Take 1 tablet (25 mg total) by mouth daily.   Marland Kitchen levothyroxine (SYNTHROID, LEVOTHROID) 75 MCG tablet TAKE 1 TABLET BY MOUTH EVERY DAY. NEEDS AN OV.   . Multiple Vitamins-Minerals (MULTIVITAMIN WITH MINERALS) tablet Take 1 tablet by mouth daily.   . valsartan (DIOVAN) 80 MG tablet Take 80 mg by mouth daily.  09/02/2017: Patient is on 40 mg daily  . VYZULTA 0.024 % SOLN INSTILL 1 DROP INTO RIGHT EYE IN THE EVENING   . memantine (NAMENDA) 5 MG tablet Take 1 tablet (5 mg total) by mouth 2 (two) times daily. (Patient not taking: Reported on 06/16/2018)    No facility-administered encounter medications on file as of 06/16/2018.     No Known Allergies  Review of Systems  Constitutional: Negative for fever and malaise/fatigue.  Respiratory: Negative for cough, shortness of breath and wheezing.   Cardiovascular: Negative for chest pain, palpitations, orthopnea, claudication and leg swelling.  Musculoskeletal:       Jaw necrosis followed at [facility] school.  Endo/Heme/Allergies: Negative.   Psychiatric/Behavioral: Negative.     Objective:  BP 138/70 (BP Location: Right Arm, Patient Position: Sitting, Cuff Size: Normal)   Pulse 70   Temp 98.6 F (37 C) (Oral)   Resp 16   Wt 103 lb (46.7 kg)   SpO2 94%   BMI 19.46 kg/m   Physical Exam  Constitutional: She is oriented to person, place, and time and well-developed, well-nourished, and in no distress.  HENT:  Head: Normocephalic and atraumatic.  Eyes: Conjunctivae are normal. No scleral icterus.  Neck: No thyromegaly present.  Cardiovascular: Normal rate, regular rhythm and normal heart sounds.  Pulmonary/Chest: Effort normal and breath sounds normal.  Abdominal: Soft.    Lymphadenopathy:    She has no cervical adenopathy.  Neurological: She is alert and oriented to person, place, and time. Gait normal. GCS score is 15.  Skin: Skin is warm and dry.  Skin lesion on leg.  Psychiatric: Mood, memory, affect and judgment normal.    Assessment and Plan :  1. Benign hypertensive heart and CKD, stage 3 (GFR 30-59), w CHF ([place])   2. Hypothyroidism, unspecified type   3. Age-related osteoporosis without current pathological fracture   4. Osteonecrosis ([place]) Follow-up with [facility] dental school.  5. MCI (mild cognitive impairment) Patient is concerned  about this.  Restart Namenda. she is  intolerant of Aricept Return 2 to 3 months.  .I have done the exam and reviewed the chart and it is accurate to the best of my knowledge. Development worker, community has been used and  any errors in dictation or transcription are unintentional. Miguel Aschoff M.D. [facility]

## 2018-07-01 ENCOUNTER — Other Ambulatory Visit: Payer: Self-pay | Admitting: Family Medicine

## 2018-07-01 NOTE — Telephone Encounter (Signed)
Pt needing a refill on: levothyroxine (SYNTHROID, LEVOTHROID) 75 MCG tablet - pt is out.  Please fill at:  [id number] [address], [place] (Phone) [phone] (Fax)   Thanks, [facility]

## 2018-07-05 NOTE — Telephone Encounter (Signed)
Please review. Thanks!  

## 2018-07-11 MED ORDER — LEVOTHYROXINE SODIUM 75 MCG PO TABS: 75.0000 ug | ORAL_TABLET | Freq: Every day | ORAL | 3 refills | Status: DC

## 2018-07-12 DIAGNOSIS — H4052X3 Glaucoma secondary to other eye disorders, left eye, severe stage: Secondary | ICD-10-CM | POA: Diagnosis not present

## 2018-07-26 DIAGNOSIS — N261 Atrophy of kidney (terminal): Secondary | ICD-10-CM | POA: Diagnosis not present

## 2018-07-26 DIAGNOSIS — N183 Chronic kidney disease, stage 3 (moderate): Secondary | ICD-10-CM | POA: Diagnosis not present

## 2018-07-26 DIAGNOSIS — I129 Hypertensive chronic kidney disease with stage 1 through stage 4 chronic kidney disease, or unspecified chronic kidney disease: Secondary | ICD-10-CM | POA: Diagnosis not present

## 2018-08-23 DIAGNOSIS — H4052X3 Glaucoma secondary to other eye disorders, left eye, severe stage: Secondary | ICD-10-CM | POA: Diagnosis not present

## 2018-08-26 ENCOUNTER — Ambulatory Visit
Admission: RE | Admit: 2018-08-26 | Discharge: 2018-08-26 | Disposition: A | Discharge status: Home / Self Care | Payer: Medicare HMO | Source: Ambulatory Visit | Attending: Family Medicine | Admitting: Family Medicine

## 2018-08-26 ENCOUNTER — Encounter: Payer: Self-pay | Admitting: Family Medicine

## 2018-08-26 ENCOUNTER — Ambulatory Visit (INDEPENDENT_AMBULATORY_CARE_PROVIDER_SITE_OTHER): Payer: Medicare HMO | Admitting: Family Medicine

## 2018-08-26 VITALS — BP 130/70 | HR 68 | Temp 97.9°F | Resp 16 | Wt 103.0 lb

## 2018-08-26 DIAGNOSIS — W19XXXA Unspecified fall, initial encounter: Secondary | ICD-10-CM

## 2018-08-26 DIAGNOSIS — M5136 Other intervertebral disc degeneration, lumbar region: Secondary | ICD-10-CM | POA: Diagnosis not present

## 2018-08-26 DIAGNOSIS — M79605 Pain in left leg: Secondary | ICD-10-CM | POA: Diagnosis not present

## 2018-08-26 DIAGNOSIS — S79912A Unspecified injury of left hip, initial encounter: Secondary | ICD-10-CM | POA: Diagnosis not present

## 2018-08-26 DIAGNOSIS — M160 Bilateral primary osteoarthritis of hip: Secondary | ICD-10-CM | POA: Insufficient documentation

## 2018-08-26 NOTE — Progress Notes (Signed)
Patient: Erin Ibarra Female    DOB: 16-Jun-1930   85 y.o.   MRN: [id number] Visit Date: 08/26/2018  Today's Provider: Vernie Murders, PA   Chief Complaint  Patient presents with  . Leg Pain   Subjective:     Leg Pain   The incident occurred more than 1 week ago. The injury mechanism was a fall (fell yesterday). The pain is present in the left leg and left thigh. The pain is at a severity of 6/10. The pain is moderate. Associated symptoms include an inability to bear weight, a loss of motion and numbness. She reports no foreign bodies present.   Patient states she has had left leg pain for over 1 week. Patient states pain is intermittent. She has been having cramps and soreness in left legs. Patient states she fell yesterday and left leg has been hurting worse. Patient has been taking Advil for pain.   Past Medical History:  Diagnosis Date  . Bladder cancer ([place]) 1991   Hx TUR and chemo tx's.   . Chronic kidney disease   . Glaucoma   . Heart murmur   . Hypertension   . Renal insufficiency    Hx Stage 3 kidney disease.  . Thyroid disease    Past Surgical History:  Procedure Laterality Date  . broke knee cap    . CATARACT EXTRACTION  2015  . COLONOSCOPY  2006  . TRANSURETHRAL RESECTION OF BLADDER  1991   Family History  Problem Relation Age of Onset  . Prostate cancer Father   . Cancer Father        prostate  . Heart disease Father        MI  . Pancreatic cancer Brother   . Lung cancer Brother   . Cancer Brother        brain, lung  . Heart disease Brother        MI   No Known Allergies  Current Outpatient Medications:  .  aspirin 81 MG tablet, Take 81 mg by mouth daily., Disp: , Rfl:  .  brimonidine (ALPHAGAN) 0.2 % ophthalmic solution, Place 1 drop into the right eye 2 (two) times daily. , Disp: , Rfl:  .  Calcium Carbonate-Vit D-Min (CALCIUM 600 + MINERALS PO), Take 1 tablet by mouth daily., Disp: , Rfl:  .  carvedilol (COREG) 25 MG tablet,  Take 12.5 mg by mouth 2 (two) times daily. , Disp: , Rfl: 7 .  dorzolamide-timolol (COSOPT) 22.3-6.8 MG/ML ophthalmic solution, USE 1 DROP(S) IN BOTH EYES 2 TIMES A DAY, Disp: , Rfl: 5 .  Fish Oil-Cholecalciferol (FISH OIL + D3) 1000-1000 MG-UNIT CAPS, Take by mouth 2 (two) times daily., Disp: , Rfl:  .  hydrochlorothiazide (HYDRODIURIL) 25 MG tablet, Take 1 tablet (25 mg total) by mouth daily., Disp: 90 tablet, Rfl: 3 .  levothyroxine (SYNTHROID, LEVOTHROID) 75 MCG tablet, Take 1 tablet (75 mcg total) by mouth daily before breakfast., Disp: 90 tablet, Rfl: 3 .  memantine (NAMENDA) 5 MG tablet, Take 1 tablet (5 mg total) by mouth 2 (two) times daily., Disp: 60 tablet, Rfl: 5 .  Multiple Vitamins-Minerals (MULTIVITAMIN WITH MINERALS) tablet, Take 1 tablet by mouth daily., Disp: , Rfl:  .  valsartan (DIOVAN) 80 MG tablet, Take 80 mg by mouth daily. , Disp: , Rfl: 4 .  VYZULTA 0.024 % SOLN, INSTILL 1 DROP INTO RIGHT EYE IN THE EVENING, Disp: , Rfl: 6  Review of Systems  Constitutional:  Negative for appetite change, chills, fatigue and fever.  Respiratory: Negative for chest tightness and shortness of breath.   Cardiovascular: Negative for chest pain and palpitations.  Gastrointestinal: Negative for abdominal pain, nausea and vomiting.  Neurological: Positive for numbness. Negative for dizziness and weakness.   Social History   Tobacco Use  . Smoking status: Former Smoker    Packs/day: 1.00    Years: 15.00    Pack years: 15.00    Types: Cigarettes  . Smokeless tobacco: Never Used  . Tobacco comment: > 15 years ago  Substance Use Topics  . Alcohol use: Yes    Alcohol/week: 7.0 standard drinks    Types: 7 Glasses of wine per week    Comment: 1 glass of wine qhs     Objective:   BP 130/70 (BP Location: Right Arm, Patient Position: Sitting, Cuff Size: Normal)   Pulse 68   Temp 97.9 F (36.6 C) (Oral)   Resp 16   Wt 103 lb (46.7 kg)   SpO2 91%   BMI 19.46 kg/m  Vitals:   08/26/18  1601  BP: 130/70  Pulse: 68  Resp: 16  Temp: 97.9 F (36.6 C)  TempSrc: Oral  SpO2: 91%  Weight: 103 lb (46.7 kg)   Physical Exam Constitutional:      General: She is not in acute distress.    Appearance: She is well-developed.  HENT:     Head: Normocephalic and atraumatic.     Right Ear: Hearing normal.     Left Ear: Hearing normal.     Nose: Nose normal.  Eyes:     General: Lids are normal. No scleral icterus.       Right eye: No discharge.        Left eye: No discharge.     Conjunctiva/sclera: Conjunctivae normal.  Cardiovascular:     Rate and Rhythm: Normal rate and regular rhythm.     Heart sounds: Normal heart sounds.  Pulmonary:     Effort: Pulmonary effort is normal. No respiratory distress.     Breath sounds: Normal breath sounds.  Abdominal:     General: Bowel sounds are normal.  Musculoskeletal:     Comments: Pain to lift the left leg. Pulses are symmetric. Frail frame with past history of osteoporosis. DTR's diminished but symmetric.  Skin:    Findings: No lesion or rash.  Neurological:     Mental Status: She is alert and oriented to person, place, and time.  Psychiatric:        Speech: Speech normal.        Behavior: Behavior normal.        Thought Content: Thought content normal.       Assessment & Plan    1. Fall, initial encounter Walking her dog this morning and fell (snow on the ground). Having difficulty lifting the left leg. Describes pain and a "soreness" in the anterior thigh. Will get x-ray of hip to rule out fracture. - DG Hip Unilat W OR W/O Pelvis 1V Left  2. Left leg pain Has had some left leg hurting/soreness in the quadriceps area and more pain to lift the left leg. Adamant that the discomfort was present before he fall today. Can't pull the left leg through when walking with a rolling walker. Chart indicates a history of osteoporosis and had to stop the Alendronate due to jaw bone issue (?osteonecrosis?). I am concerned she had a  pathologic fracture before the fall. Will get x-rays now and  may use Tylenol for discomfort. - DG Hip Unilat W OR W/O Pelvis 1V Left     Vernie Murders, PA  [facility]

## 2018-09-01 DIAGNOSIS — M87180 Osteonecrosis due to drugs, jaw: Secondary | ICD-10-CM | POA: Diagnosis not present

## 2018-09-15 ENCOUNTER — Ambulatory Visit (INDEPENDENT_AMBULATORY_CARE_PROVIDER_SITE_OTHER): Payer: Medicare HMO | Admitting: Family Medicine

## 2018-09-15 ENCOUNTER — Other Ambulatory Visit: Payer: Self-pay

## 2018-09-15 VITALS — BP 122/60 | HR 67 | Temp 97.5°F | Resp 18 | Wt 103.0 lb

## 2018-09-15 DIAGNOSIS — I130 Hypertensive heart and chronic kidney disease with heart failure and stage 1 through stage 4 chronic kidney disease, or unspecified chronic kidney disease: Secondary | ICD-10-CM

## 2018-09-15 DIAGNOSIS — N183 Chronic kidney disease, stage 3 (moderate): Secondary | ICD-10-CM

## 2018-09-15 DIAGNOSIS — H53003 Unspecified amblyopia, bilateral: Secondary | ICD-10-CM

## 2018-09-15 DIAGNOSIS — S8011XA Contusion of right lower leg, initial encounter: Secondary | ICD-10-CM | POA: Diagnosis not present

## 2018-09-15 DIAGNOSIS — M810 Age-related osteoporosis without current pathological fracture: Secondary | ICD-10-CM | POA: Diagnosis not present

## 2018-09-15 DIAGNOSIS — B079 Viral wart, unspecified: Secondary | ICD-10-CM

## 2018-09-15 NOTE — Progress Notes (Signed)
Erin Ibarra  MRN: [id number] DOB: 1929-10-08  Subjective:  HPI   The patient is an 85 year old female who presents for an evaluation of a warty lesion on her chest that she would like to have removed.  She has noticed it for few months.  She thinks she scratched it off at one time but it came back.  The patient also would like you to look at her leg.  She fell about 2 weeks ago and has bruising all along her left leg.  She states it does nothurt and in the fall she did not hurt herself.  She does note that it appears to be a little swollen. She simply simply tripped to cause the fall.  There was no syncope.  No presyncope. Patient Active Problem List   Diagnosis Date Noted  . Benign hypertensive heart and CKD, stage 3 (GFR 30-59), w CHF ([place]) 09/09/2015  . Hyperkalemia 09/09/2015  . Amblyopia 09/06/2015  . Back ache 09/06/2015  . Aortic valve, bicuspid 09/06/2015  . Bladder cancer ([place]) 09/06/2015  . Colon polyp 09/06/2015  . DD (diverticular disease) 09/06/2015  . Breathlessness on exertion 09/06/2015  . Hypercholesteremia 09/06/2015  . Below normal amount of sodium in the blood 09/06/2015  . Cervical pain 09/06/2015  . Amnesia 09/06/2015  . Post menopausal syndrome 09/06/2015  . Disorder of scalp 09/06/2015  . Hypo-osmolality and hyponatremia 09/06/2015  . Diverticulitis 09/06/2015  . Aortic regurgitation, congenital 09/06/2015  . OP (osteoporosis) 05/20/2015  . Age-related osteoporosis without current pathological fracture 05/20/2015  . Hypothyroidism 12/26/2014  . Encounter for screening colonoscopy 08/29/2014  . BP (high blood pressure) 02/03/2013  . Difficulty hearing 02/03/2013  . Cataract 10/14/2012  . Central retinal vein occlusion 10/14/2012  . 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 Cardiac failure ([place]) 06/21/2008  . Heart failure ([place]) 06/21/2008    Past Medical History:  Diagnosis Date  . Bladder cancer ([place]) 1991   Hx TUR and chemo tx's.   .  Chronic kidney disease   . Glaucoma   . Heart murmur   . Hypertension   . Renal insufficiency    Hx Stage 3 kidney disease.  . Thyroid disease     Social History   Socioeconomic History  . Marital status: Widowed    Spouse name: Not on file  . Number of children: 4  . Years of education: Not on file  . Highest education level: Bachelor's degree (e.g., BA, AB, BS)  Occupational History  . Occupation: retired  Scientific laboratory technician  . Financial resource strain: Not hard at all  . Food insecurity:    Worry: Never true    Inability: Never true  . Transportation needs:    Medical: No    Non-medical: No  Tobacco Use  . Smoking status: Former Smoker    Packs/day: 1.00    Years: 15.00    Pack years: 15.00    Types: Cigarettes  . Smokeless tobacco: Never Used  . Tobacco comment: > 15 years ago  Substance and Sexual Activity  . Alcohol use: Yes    Alcohol/week: 7.0 standard drinks    Types: 7 Glasses of wine per week    Comment: 1 glass of wine qhs  . Drug use: No  . Sexual activity: Not on file  Lifestyle  . Physical activity:    Days per week: Not on file    Minutes per session: Not on file  . Stress: Not at all  Relationships  . Social  connections:    Talks on phone: Not on file    Gets together: Not on file    Attends religious service: Not on file    Active member of club or organization: Not on file    Attends meetings of clubs or organizations: Not on file    Relationship status: Not on file  . Intimate partner violence:    Fear of current or ex partner: Not on file    Emotionally abused: Not on file    Physically abused: Not on file    Forced sexual activity: Not on file  Other Topics Concern  . Not on file  Social History Narrative  . Not on file    Outpatient Encounter Medications as of 09/15/2018  Medication Sig Note  . aspirin 81 MG tablet Take 81 mg by mouth daily.   . brimonidine (ALPHAGAN) 0.2 % ophthalmic solution Place 1 drop into the right eye 2  (two) times daily.    . Calcium Carbonate-Vit D-Min (CALCIUM 600 + MINERALS PO) Take 1 tablet by mouth daily.   . carvedilol (COREG) 25 MG tablet Take 12.5 mg by mouth 2 (two) times daily.    . dorzolamide-timolol (COSOPT) 22.3-6.8 MG/ML ophthalmic solution USE 1 DROP(S) IN BOTH EYES 2 TIMES A DAY   . Fish Oil-Cholecalciferol (FISH OIL + D3) 1000-1000 MG-UNIT CAPS Take by mouth 2 (two) times daily.   . hydrochlorothiazide (HYDRODIURIL) 25 MG tablet Take 1 tablet (25 mg total) by mouth daily.   Marland Kitchen levothyroxine (SYNTHROID, LEVOTHROID) 75 MCG tablet Take 1 tablet (75 mcg total) by mouth daily before breakfast.   . memantine (NAMENDA) 5 MG tablet Take 1 tablet (5 mg total) by mouth 2 (two) times daily.   . Multiple Vitamins-Minerals (MULTIVITAMIN WITH MINERALS) tablet Take 1 tablet by mouth daily.   . valsartan (DIOVAN) 80 MG tablet Take 80 mg by mouth daily.  09/02/2017: Patient is on 40 mg daily  . VYZULTA 0.024 % SOLN INSTILL 1 DROP INTO RIGHT EYE IN THE EVENING    No facility-administered encounter medications on file as of 09/15/2018.     No Known Allergies  Review of Systems  Constitutional: Negative.   HENT: Negative.   Eyes:       Poor vision.  Respiratory: Negative for cough, shortness of breath and wheezing.   Cardiovascular: Negative for chest pain and palpitations.  Gastrointestinal: Negative.   Genitourinary: Negative.   Musculoskeletal: Positive for back pain.  Skin: Negative for itching and rash.  Endo/Heme/Allergies: Bruises/bleeds easily.  Psychiatric/Behavioral: Negative.     Objective:  BP 122/60 (BP Location: Right Arm, Patient Position: Sitting, Cuff Size: Normal)   Pulse 67   Temp (!) 97.5 F (36.4 C) (Oral)   Resp 18   Wt 103 lb (46.7 kg)   BMI 19.46 kg/m   Physical Exam  Constitutional: She is oriented to person, place, and time and well-developed, well-nourished, and in no distress.  Thin, cachectic WF appears younger than her age.  HENT:  Head:  Normocephalic and atraumatic.  Eyes: No scleral icterus.  Neck: No thyromegaly present.  Cardiovascular: Normal rate, regular rhythm and normal heart sounds.  Pulmonary/Chest: Effort normal and breath sounds normal.  Abdominal: Soft.  Musculoskeletal:        General: No deformity or edema.     Comments: There is ecchymosis of the right lateral leg below the knee.  No swelling tenderness or problems with ambulation due to the pain.  Neurological: She is alert and  oriented to person, place, and time. GCS score is 15.  Skin: Skin is warm and dry.  Warty lesion on upper sternum is irritated due to necklaces and collars.  Is a good bit of ecchymosis and swelling of the right lower lateral leg but this is normal from her injury from 2 weeks ago.  It is healing well and she has no complaints about it today  Psychiatric: Mood, memory, affect and judgment normal.    Assessment and Plan :  1. Viral warts, unspecified type Frozen with cryo pen is that it is very irritated per patient  2. Contusion of right lower leg, initial encounter Nicely.  Use heating pad  3. Age-related osteoporosis without current pathological fracture   4. Amblyopia of both eyes   5. Benign hypertensive heart and CKD, stage 3 (GFR 30-59), w CHF ([place]) The lab work on the appropriate intervals

## 2018-09-22 ENCOUNTER — Other Ambulatory Visit: Payer: Self-pay | Admitting: Family Medicine

## 2018-09-22 DIAGNOSIS — G3184 Mild cognitive impairment, so stated: Secondary | ICD-10-CM

## 2018-10-03 DIAGNOSIS — E7800 Pure hypercholesterolemia, unspecified: Secondary | ICD-10-CM | POA: Diagnosis not present

## 2018-10-03 DIAGNOSIS — I10 Essential (primary) hypertension: Secondary | ICD-10-CM | POA: Diagnosis not present

## 2018-10-03 DIAGNOSIS — Q231 Congenital insufficiency of aortic valve: Secondary | ICD-10-CM | POA: Diagnosis not present

## 2018-10-03 DIAGNOSIS — I519 Heart disease, unspecified: Secondary | ICD-10-CM | POA: Diagnosis not present

## 2018-10-03 DIAGNOSIS — R0609 Other forms of dyspnea: Secondary | ICD-10-CM | POA: Diagnosis not present

## 2018-10-03 DIAGNOSIS — H348122 Central retinal vein occlusion, left eye, stable: Secondary | ICD-10-CM | POA: Diagnosis not present

## 2018-10-06 ENCOUNTER — Ambulatory Visit: Payer: Self-pay | Admitting: Family Medicine

## 2018-10-24 ENCOUNTER — Telehealth: Payer: Self-pay | Admitting: Family Medicine

## 2018-10-24 NOTE — Telephone Encounter (Signed)
Please review. Would Ensure or Boost be beneficial to the patient?

## 2018-10-24 NOTE — Telephone Encounter (Signed)
Advised daughter as below. Will call us for an update in 1 week to see how she's doing.

## 2018-10-24 NOTE — Telephone Encounter (Signed)
Either might help.

## 2018-10-24 NOTE — Telephone Encounter (Signed)
Pt's daughter called saying she talked to her mom and she had told her that she has lost some weight in the last couple of weeks to month.  She said she went to drop something off and she did notice that she looks like she has lost weight.  She is in independent living at [facility]. Daughter thinks she is not eating as well since the shelter in place as been going on and she wants to know if we can suggest anything as Ensure or Boost .  CB#  [phone]  Please call daughter.    Thanks [employer]

## 2018-10-30 ENCOUNTER — Other Ambulatory Visit: Payer: Self-pay | Admitting: Family Medicine

## 2018-10-30 DIAGNOSIS — G3184 Mild cognitive impairment, so stated: Secondary | ICD-10-CM

## 2018-11-16 ENCOUNTER — Ambulatory Visit: Payer: Self-pay | Admitting: Family Medicine

## 2018-11-22 DIAGNOSIS — H4052X3 Glaucoma secondary to other eye disorders, left eye, severe stage: Secondary | ICD-10-CM | POA: Diagnosis not present

## 2018-12-19 ENCOUNTER — Ambulatory Visit (INDEPENDENT_AMBULATORY_CARE_PROVIDER_SITE_OTHER): Payer: Medicare HMO | Admitting: Family Medicine

## 2018-12-19 ENCOUNTER — Encounter: Payer: Self-pay | Admitting: Family Medicine

## 2018-12-19 ENCOUNTER — Other Ambulatory Visit: Payer: Self-pay

## 2018-12-19 VITALS — BP 128/62 | HR 68 | Temp 97.5°F | Resp 16 | Ht 61.0 in | Wt 101.0 lb

## 2018-12-19 DIAGNOSIS — E039 Hypothyroidism, unspecified: Secondary | ICD-10-CM

## 2018-12-19 DIAGNOSIS — I130 Hypertensive heart and chronic kidney disease with heart failure and stage 1 through stage 4 chronic kidney disease, or unspecified chronic kidney disease: Secondary | ICD-10-CM

## 2018-12-19 DIAGNOSIS — N951 Menopausal and female climacteric states: Secondary | ICD-10-CM | POA: Diagnosis not present

## 2018-12-19 DIAGNOSIS — M810 Age-related osteoporosis without current pathological fracture: Secondary | ICD-10-CM

## 2018-12-19 DIAGNOSIS — G3184 Mild cognitive impairment, so stated: Secondary | ICD-10-CM

## 2018-12-19 DIAGNOSIS — C679 Malignant neoplasm of bladder, unspecified: Secondary | ICD-10-CM

## 2018-12-19 DIAGNOSIS — N183 Chronic kidney disease, stage 3 (moderate): Secondary | ICD-10-CM

## 2018-12-19 NOTE — Progress Notes (Signed)
Patient: Erin Ibarra Female    DOB: June 14, 1930   85 y.o.   MRN: [id number] Visit Date: 12/19/2018  Today's Provider: Wilhemena Durie, MD   Chief Complaint  Patient presents with  . Follow-up  . mild cognitive impairment   Subjective:    HPI Patient comes in today for a follow up on MCI. She was last seen in the office for this about 6 months ago. She was advised to start back on Namenda to help with her memory. She was previously on Aricept, and she could not tolerate the medication due to having vivid dreams.   BP Readings from Last 3 Encounters:  12/19/18 128/62  09/15/18 122/60  08/26/18 130/70   Wt Readings from Last 3 Encounters:  12/19/18 101 lb (45.8 kg)  09/15/18 103 lb (46.7 kg)  08/26/18 103 lb (46.7 kg)    No Known Allergies   Current Outpatient Medications:  .  aspirin 81 MG tablet, Take 81 mg by mouth daily., Disp: , Rfl:  .  brimonidine (ALPHAGAN) 0.2 % ophthalmic solution, Place 1 drop into the right eye 2 (two) times daily. , Disp: , Rfl:  .  Calcium Carbonate-Vit D-Min (CALCIUM 600 + MINERALS PO), Take 1 tablet by mouth daily., Disp: , Rfl:  .  carvedilol (COREG) 25 MG tablet, Take 12.5 mg by mouth 2 (two) times daily. , Disp: , Rfl: 7 .  dorzolamide-timolol (COSOPT) 22.3-6.8 MG/ML ophthalmic solution, USE 1 DROP(S) IN BOTH EYES 2 TIMES A DAY, Disp: , Rfl: 5 .  Fish Oil-Cholecalciferol (FISH OIL + D3) 1000-1000 MG-UNIT CAPS, Take by mouth 2 (two) times daily., Disp: , Rfl:  .  hydrochlorothiazide (HYDRODIURIL) 25 MG tablet, Take 1 tablet (25 mg total) by mouth daily., Disp: 90 tablet, Rfl: 3 .  levothyroxine (SYNTHROID, LEVOTHROID) 75 MCG tablet, Take 1 tablet (75 mcg total) by mouth daily before breakfast., Disp: 90 tablet, Rfl: 3 .  memantine (NAMENDA) 5 MG tablet, TAKE 1 TABLET BY MOUTH TWICE A DAY, Disp: 180 tablet, Rfl: 1 .  Multiple Vitamins-Minerals (MULTIVITAMIN WITH MINERALS) tablet, Take 1 tablet by mouth daily., Disp: , Rfl:  .   valsartan (DIOVAN) 80 MG tablet, Take 80 mg by mouth daily. , Disp: , Rfl: 4 .  VYZULTA 0.024 % SOLN, INSTILL 1 DROP INTO RIGHT EYE IN THE EVENING, Disp: , Rfl: 6  Review of Systems  Constitutional: Negative for activity change, appetite change, diaphoresis, fatigue, fever and unexpected weight change.  HENT: Negative.   Eyes: Positive for visual disturbance.  Respiratory: Negative for cough and shortness of breath.   Cardiovascular: Negative for chest pain, palpitations and leg swelling.  Gastrointestinal: Negative.   Endocrine: Negative.   Allergic/Immunologic: Negative for environmental allergies.  Neurological: Negative for dizziness, light-headedness and headaches.  Psychiatric/Behavioral: Negative for agitation, confusion, decreased concentration, hallucinations, self-injury, sleep disturbance and suicidal ideas. The patient is not nervous/anxious and is not hyperactive.     Social History   Tobacco Use  . Smoking status: Former Smoker    Packs/day: 1.00    Years: 15.00    Pack years: 15.00    Types: Cigarettes  . Smokeless tobacco: Never Used  . Tobacco comment: > 15 years ago  Substance Use Topics  . Alcohol use: Yes    Alcohol/week: 7.0 standard drinks    Types: 7 Glasses of wine per week    Comment: 1 glass of wine qhs      Objective:   BP  128/62 (BP Location: Right Arm, Patient Position: Sitting, Cuff Size: Normal)   Pulse 68   Temp (!) 97.5 F (36.4 C)   Resp 16   Ht 5\' 1"  (1.549 m)   Wt 101 lb (45.8 kg)   SpO2 98%   BMI 19.08 kg/m  Vitals:   12/19/18 1341  BP: 128/62  Pulse: 68  Resp: 16  Temp: (!) 97.5 F (36.4 C)  SpO2: 98%  Weight: 101 lb (45.8 kg)  Height: 5\' 1"  (1.549 m)     Physical Exam Vitals signs reviewed.  Constitutional:      Appearance: She is well-developed.     Comments: Cachectic WF NAD  HENT:     Head: Normocephalic and atraumatic.     Right Ear: External ear normal.     Left Ear: External ear normal.     Nose: Nose  normal.  Eyes:     Conjunctiva/sclera: Conjunctivae normal.     Pupils: Pupils are equal, round, and reactive to light.  Neck:     Musculoskeletal: Normal range of motion and neck supple.  Cardiovascular:     Rate and Rhythm: Normal rate and regular rhythm.     Heart sounds: Normal heart sounds.  Pulmonary:     Effort: Pulmonary effort is normal.     Breath sounds: Normal breath sounds.  Abdominal:     General: Bowel sounds are normal.     Palpations: Abdomen is soft.  Musculoskeletal: Normal range of motion.  Skin:    General: Skin is warm and dry.  Neurological:     Mental Status: She is alert and oriented to person, place, and time.  Psychiatric:        Behavior: Behavior normal.        Thought Content: Thought content normal.        Judgment: Judgment normal.         Assessment & Plan    1. Benign hypertensive heart and CKD, stage 3 (GFR 30-59), w CHF ([place])   2. Hypothyroidism, unspecified type   3. Age-related osteoporosis without current pathological fracture   4. Post menopausal syndrome   5. Malignant neoplasm of urinary bladder, unspecified site ([place])   6. MCI (mild cognitive impairment) with memory loss Tolerating Namenda.RTC 4 months.    I have done the exam and reviewed the above chart and it is accurate to the best of my knowledge. Development worker, community has been used in this note in any air is in the dictation or transcription are unintentional.  Wilhemena Durie, MD  [place]

## 2018-12-23 DIAGNOSIS — G3184 Mild cognitive impairment, so stated: Secondary | ICD-10-CM | POA: Insufficient documentation

## 2019-01-24 DIAGNOSIS — H4052X3 Glaucoma secondary to other eye disorders, left eye, severe stage: Secondary | ICD-10-CM | POA: Diagnosis not present

## 2019-02-21 DIAGNOSIS — H401113 Primary open-angle glaucoma, right eye, severe stage: Secondary | ICD-10-CM | POA: Diagnosis not present

## 2019-03-10 ENCOUNTER — Other Ambulatory Visit: Payer: Self-pay | Admitting: Family Medicine

## 2019-03-10 DIAGNOSIS — G3184 Mild cognitive impairment, so stated: Secondary | ICD-10-CM

## 2019-03-15 ENCOUNTER — Emergency Department: Payer: Medicare HMO

## 2019-03-15 ENCOUNTER — Encounter: Payer: Self-pay | Admitting: Emergency Medicine

## 2019-03-15 ENCOUNTER — Other Ambulatory Visit: Payer: Self-pay

## 2019-03-15 DIAGNOSIS — Z800 Family history of malignant neoplasm of digestive organs: Secondary | ICD-10-CM

## 2019-03-15 DIAGNOSIS — E039 Hypothyroidism, unspecified: Secondary | ICD-10-CM | POA: Diagnosis not present

## 2019-03-15 DIAGNOSIS — Z20828 Contact with and (suspected) exposure to other viral communicable diseases: Secondary | ICD-10-CM | POA: Diagnosis not present

## 2019-03-15 DIAGNOSIS — R42 Dizziness and giddiness: Secondary | ICD-10-CM | POA: Diagnosis not present

## 2019-03-15 DIAGNOSIS — B9620 Unspecified Escherichia coli [E. coli] as the cause of diseases classified elsewhere: Secondary | ICD-10-CM | POA: Diagnosis present

## 2019-03-15 DIAGNOSIS — G9349 Other encephalopathy: Secondary | ICD-10-CM | POA: Diagnosis not present

## 2019-03-15 DIAGNOSIS — N183 Chronic kidney disease, stage 3 (moderate): Secondary | ICD-10-CM | POA: Diagnosis not present

## 2019-03-15 DIAGNOSIS — Z03818 Encounter for observation for suspected exposure to other biological agents ruled out: Secondary | ICD-10-CM | POA: Diagnosis not present

## 2019-03-15 DIAGNOSIS — I951 Orthostatic hypotension: Secondary | ICD-10-CM | POA: Diagnosis not present

## 2019-03-15 DIAGNOSIS — Z8551 Personal history of malignant neoplasm of bladder: Secondary | ICD-10-CM

## 2019-03-15 DIAGNOSIS — I6502 Occlusion and stenosis of left vertebral artery: Secondary | ICD-10-CM | POA: Diagnosis not present

## 2019-03-15 DIAGNOSIS — Z801 Family history of malignant neoplasm of trachea, bronchus and lung: Secondary | ICD-10-CM

## 2019-03-15 DIAGNOSIS — Z9221 Personal history of antineoplastic chemotherapy: Secondary | ICD-10-CM

## 2019-03-15 DIAGNOSIS — Z7982 Long term (current) use of aspirin: Secondary | ICD-10-CM | POA: Diagnosis not present

## 2019-03-15 DIAGNOSIS — N179 Acute kidney failure, unspecified: Secondary | ICD-10-CM | POA: Diagnosis not present

## 2019-03-15 DIAGNOSIS — H409 Unspecified glaucoma: Secondary | ICD-10-CM | POA: Diagnosis present

## 2019-03-15 DIAGNOSIS — Z79890 Hormone replacement therapy (postmenopausal): Secondary | ICD-10-CM

## 2019-03-15 DIAGNOSIS — I129 Hypertensive chronic kidney disease with stage 1 through stage 4 chronic kidney disease, or unspecified chronic kidney disease: Secondary | ICD-10-CM | POA: Diagnosis present

## 2019-03-15 DIAGNOSIS — G9341 Metabolic encephalopathy: Secondary | ICD-10-CM | POA: Diagnosis present

## 2019-03-15 DIAGNOSIS — R410 Disorientation, unspecified: Secondary | ICD-10-CM | POA: Diagnosis not present

## 2019-03-15 DIAGNOSIS — Z79899 Other long term (current) drug therapy: Secondary | ICD-10-CM | POA: Diagnosis not present

## 2019-03-15 DIAGNOSIS — I10 Essential (primary) hypertension: Secondary | ICD-10-CM | POA: Diagnosis not present

## 2019-03-15 DIAGNOSIS — Z8042 Family history of malignant neoplasm of prostate: Secondary | ICD-10-CM

## 2019-03-15 DIAGNOSIS — Z87891 Personal history of nicotine dependence: Secondary | ICD-10-CM | POA: Diagnosis not present

## 2019-03-15 DIAGNOSIS — E860 Dehydration: Secondary | ICD-10-CM | POA: Diagnosis present

## 2019-03-15 DIAGNOSIS — E7800 Pure hypercholesterolemia, unspecified: Secondary | ICD-10-CM | POA: Diagnosis not present

## 2019-03-15 DIAGNOSIS — R270 Ataxia, unspecified: Secondary | ICD-10-CM | POA: Diagnosis not present

## 2019-03-15 DIAGNOSIS — Z8249 Family history of ischemic heart disease and other diseases of the circulatory system: Secondary | ICD-10-CM | POA: Diagnosis not present

## 2019-03-15 DIAGNOSIS — N390 Urinary tract infection, site not specified: Secondary | ICD-10-CM | POA: Diagnosis not present

## 2019-03-15 DIAGNOSIS — R29810 Facial weakness: Secondary | ICD-10-CM | POA: Diagnosis not present

## 2019-03-15 DIAGNOSIS — N3000 Acute cystitis without hematuria: Principal | ICD-10-CM | POA: Diagnosis present

## 2019-03-15 DIAGNOSIS — R531 Weakness: Secondary | ICD-10-CM | POA: Diagnosis not present

## 2019-03-15 LAB — CBC WITH DIFFERENTIAL/PLATELET
Abs Immature Granulocytes: 0.05 10*3/uL (ref 0.00–0.07)
Basophils Absolute: 0.1 10*3/uL (ref 0.0–0.1)
Basophils Relative: 1 %
Eosinophils Absolute: 0.2 10*3/uL (ref 0.0–0.5)
Eosinophils Relative: 2 %
HCT: 36.2 % (ref 36.0–46.0)
Hemoglobin: 12 g/dL (ref 12.0–15.0)
Immature Granulocytes: 1 %
Lymphocytes Relative: 20 %
Lymphs Abs: 2.2 10*3/uL (ref 0.7–4.0)
MCH: 32.1 pg (ref 26.0–34.0)
MCHC: 33.1 g/dL (ref 30.0–36.0)
MCV: 96.8 fL (ref 80.0–100.0)
Monocytes Absolute: 1.1 10*3/uL — ABNORMAL HIGH (ref 0.1–1.0)
Monocytes Relative: 10 %
Neutro Abs: 7.6 10*3/uL (ref 1.7–7.7)
Neutrophils Relative %: 66 %
Platelets: 312 10*3/uL (ref 150–400)
RBC: 3.74 MIL/uL — ABNORMAL LOW (ref 3.87–5.11)
RDW: 13.2 % (ref 11.5–15.5)
WBC: 11.1 10*3/uL — ABNORMAL HIGH (ref 4.0–10.5)
nRBC: 0 % (ref 0.0–0.2)

## 2019-03-15 LAB — COMPREHENSIVE METABOLIC PANEL
ALT: 15 U/L (ref 0–44)
AST: 18 U/L (ref 15–41)
Albumin: 3.5 g/dL (ref 3.5–5.0)
Alkaline Phosphatase: 60 U/L (ref 38–126)
Anion gap: 14 (ref 5–15)
BUN: 69 mg/dL — ABNORMAL HIGH (ref 8–23)
CO2: 22 mmol/L (ref 22–32)
Calcium: 9.9 mg/dL (ref 8.9–10.3)
Chloride: 97 mmol/L — ABNORMAL LOW (ref 98–111)
Creatinine, Ser: 1.99 mg/dL — ABNORMAL HIGH (ref 0.44–1.00)
GFR calc Af Amer: 26 mL/min — ABNORMAL LOW (ref >60)
GFR calc non Af Amer: 22 mL/min — ABNORMAL LOW (ref >60)
Glucose, Bld: 112 mg/dL — ABNORMAL HIGH (ref 70–99)
Potassium: 4.2 mmol/L (ref 3.5–5.1)
Sodium: 133 mmol/L — ABNORMAL LOW (ref 135–145)
Total Bilirubin: 0.7 mg/dL (ref 0.3–1.2)
Total Protein: 6.6 g/dL (ref 6.5–8.1)

## 2019-03-15 LAB — TROPONIN I (HIGH SENSITIVITY): Troponin I (High Sensitivity): 5 ng/L (ref <18)

## 2019-03-15 NOTE — ED Triage Notes (Signed)
Pt to triage via w/c with no distress noted, mask in place; pt reports dizziness and confusion today; st fell today but denies any injuries

## 2019-03-15 NOTE — Progress Notes (Deleted)
Subjective:   Erin Ibarra is a 85 y.o. female who presents for Medicare Annual (Subsequent) preventive examination.  Review of Systems:  N/A        Objective:     Vitals: There were no vitals taken for this visit.  There is no height or weight on file to calculate BMI.  Advanced Directives 03/10/2018 10/24/2017 07/13/2017 03/04/2017 09/24/2016 09/09/2015  Does Patient Have a Medical Advance Directive? Yes Yes Yes Yes Yes Yes  Type of Advance Directive Living will;Healthcare Power of [place];Living will [place];Living will [place];Living will Living will;Healthcare Power of Attorney  Does patient want to make changes to medical advance directive? - - No - Patient declined - - -  Copy of [place] in Chart? No - copy requested No - copy requested No - copy requested No - copy requested No - copy requested -    Tobacco Social History   Tobacco Use  Smoking Status Former Smoker   Packs/day: 1.00   Years: 15.00   Pack years: 15.00   Types: Cigarettes  Smokeless Tobacco Never Used  Tobacco Comment   > 15 years ago     Counseling given: Not Answered Comment: > 15 years ago   Clinical Intake:                       Past Medical History:  Diagnosis Date   Bladder cancer ([place]) 1991   Hx TUR and chemo tx's.    Chronic kidney disease    Glaucoma    Heart murmur    Hypertension    Renal insufficiency    Hx Stage 3 kidney disease.   Thyroid disease    Past Surgical History:  Procedure Laterality Date   broke knee cap     CATARACT EXTRACTION  2015   COLONOSCOPY  2006   TRANSURETHRAL RESECTION OF BLADDER  1991   Family History  Problem Relation Age of Onset   Prostate cancer Father    Cancer Father        prostate   Heart disease Father        MI   Pancreatic cancer Brother    Lung cancer Brother    Cancer  Brother        brain, lung   Heart disease Brother        MI   Social History   Socioeconomic History   Marital status: Widowed    Spouse name: Not on file   Number of children: 4   Years of education: Not on file   Highest education level: Bachelor's degree (e.g., BA, AB, BS)  Occupational History   Occupation: retired  Scientist, product/process development strain: Not hard at [employer] insecurity    Worry: Never true    Inability: Never true   Transportation needs    Medical: No    Non-medical: No  Tobacco Use   Smoking status: Former Smoker    Packs/day: 1.00    Years: 15.00    Pack years: 15.00    Types: Cigarettes   Smokeless tobacco: Never Used   Tobacco comment: > 15 years ago  Substance and Sexual Activity   Alcohol use: Yes    Alcohol/week: 7.0 standard drinks    Types: 7 Glasses of wine per week    Comment: 1 glass of wine qhs   Drug  use: No   Sexual activity: Not on file  Lifestyle   Physical activity    Days per week: Not on file    Minutes per session: Not on file   Stress: Not at all  Relationships   Social connections    Talks on phone: Not on file    Gets together: Not on file    Attends religious service: Not on file    Active member of club or organization: Not on file    Attends meetings of clubs or organizations: Not on file    Relationship status: Not on file  Other Topics Concern   Not on file  Social History Narrative   Not on file    Outpatient Encounter Medications as of 03/16/2019  Medication Sig   aspirin 81 MG tablet Take 81 mg by mouth daily.   brimonidine (ALPHAGAN) 0.2 % ophthalmic solution Place 1 drop into the right eye 2 (two) times daily.    Calcium Carbonate-Vit D-Min (CALCIUM 600 + MINERALS PO) Take 1 tablet by mouth daily.   carvedilol (COREG) 25 MG tablet Take 12.5 mg by mouth 2 (two) times daily.    dorzolamide-timolol (COSOPT) 22.3-6.8 MG/ML ophthalmic solution USE 1 DROP(S) IN BOTH EYES 2  TIMES A DAY   Fish Oil-Cholecalciferol (FISH OIL + D3) 1000-1000 MG-UNIT CAPS Take by mouth 2 (two) times daily.   hydrochlorothiazide (HYDRODIURIL) 25 MG tablet Take 1 tablet (25 mg total) by mouth daily.   levothyroxine (SYNTHROID, LEVOTHROID) 75 MCG tablet Take 1 tablet (75 mcg total) by mouth daily before breakfast.   memantine (NAMENDA) 5 MG tablet TAKE 1 TABLET BY MOUTH TWICE A DAY   Multiple Vitamins-Minerals (MULTIVITAMIN WITH MINERALS) tablet Take 1 tablet by mouth daily.   valsartan (DIOVAN) 80 MG tablet Take 80 mg by mouth daily.    VYZULTA 0.024 % SOLN INSTILL 1 DROP INTO RIGHT EYE IN THE EVENING   No facility-administered encounter medications on file as of 03/16/2019.     Activities of Daily Living No flowsheet data found.  Patient Care Team: Jerrol Banana., MD as PCP - General (Family Medicine) Murlean Iba, MD as Consulting Physician (Internal Medicine) Leandrew Koyanagi, MD as Referring Physician (Ophthalmology) Isaias Cowman, MD as Consulting Physician (Cardiology) Kipp Laurence, MD as Referring Physician (Audiology)    Assessment:   This is a routine wellness examination for Erin Ibarra.  Exercise Activities and Dietary recommendations    Goals     Increase water intake     Recommend increasing water intake to 4-6 glasses a day.        Fall Risk: Fall Risk  03/10/2018 03/04/2017 01/29/2016  Falls in the past year? Yes Yes Yes  Number falls in past yr: 2 or more 2 or more 1  Injury with Fall? Yes No No  Comment broke the left knee cap bruising -  Risk for fall due to : Impaired balance/gait;Impaired vision - -  Follow up Falls prevention discussed Falls prevention discussed -    FALL RISK PREVENTION PERTAINING TO THE HOME:  Any stairs in or around the home? {YES/NO:21197} If so, are there any without handrails? {YES/NO:21197}  Home free of loose throw rugs in walkways, pet beds, electrical cords, etc? Yes  Adequate lighting in  your home to reduce risk of falls? Yes   ASSISTIVE DEVICES UTILIZED TO PREVENT FALLS:  Life alert? {YES/NO:21197} Use of a cane, walker or w/c? {YES/NO:21197} Grab bars in the bathroom? {YES/NO:21197} Shower chair or bench  in shower? {YES/NO:21197} Elevated toilet seat or a handicapped toilet? {YES/NO:21197}   TIMED UP AND GO:  Was the test performed? No .    Depression Screen PHQ 2/9 Scores 03/10/2018 03/04/2017 03/04/2017 01/29/2016  PHQ - 2 Score 0 1 1 0  PHQ- 9 Score 4 3 - -     Cognitive Function MMSE - Mini Mental State Exam 09/02/2017  Orientation to time 5  Orientation to Place 5  Registration 3  Attention/ Calculation 5  Recall 3  Language- name 2 objects 2  Language- repeat 1  Language- follow 3 step command 3  Language- read & follow direction 1  Write a sentence 1  Copy design 1  Total score 30     6CIT Screen 03/04/2017  What Year? 0 points  What month? 0 points  What time? 0 points  Count back from 20 0 points  Months in reverse 0 points  Repeat phrase 8 points  Total Score 8    Immunization History  Administered Date(s) Administered   H1N1 07/02/2008   Influenza Split 07/19/2009   Influenza, High Dose Seasonal PF 04/06/2014, 04/06/2015, 03/04/2017, 03/10/2018   Influenza,inj,Quad PF,6+ Mos 03/29/2013   Pneumococcal Conjugate-13 04/06/2014   Pneumococcal Polysaccharide-23 04/30/1997   Td 03/06/2004, 08/04/2013   Tdap 08/04/2013   Zoster 09/20/2008    Qualifies for Shingles Vaccine? Yes  Zostavax completed 09/20/08. Due for Shingrix. Education has been provided regarding the importance of this vaccine. Pt has been advised to call insurance company to determine out of pocket expense. Advised may also receive vaccine at local pharmacy or Health Dept. Verbalized acceptance and understanding.  Tdap: Up to date  Flu Vaccine: Due for Flu vaccine. Does the patient want to receive this vaccine today?  No .   Pneumococcal Vaccine: Completed  series  Screening Tests Health Maintenance  Topic Date Due   INFLUENZA VACCINE  02/04/2019   DEXA SCAN  11/03/2021   TETANUS/TDAP  08/05/2023   PNA vac Low Risk Adult  Completed    Cancer Screenings:  Colorectal Screening: No longer required.   Mammogram: No longer required.   Bone Density: Completed 11/03/16. Results reflect OSTEOPENIA. Repeat every 5 years.   Lung Cancer Screening: (Low Dose CT Chest recommended if Age 15-80 years, 30 pack-year currently smoking OR have quit w/in 15years.) {DOES NOT does:27190::"does not"} qualify.   Additional Screening:  Vision Screening: Recommended annual ophthalmology exams for early detection of glaucoma and other disorders of the eye.  Dental Screening: Recommended annual dental exams for proper oral hygiene  Community Resource Referral:  CRR required this visit?  No       Plan:  I have personally reviewed and addressed the Medicare Annual Wellness questionnaire and have noted the following in the patients chart:  A. Medical and social history B. Use of alcohol, tobacco or illicit drugs  C. Current medications and supplements D. Functional ability and status E.  Nutritional status F.  Physical activity G. Advance directives H. List of other physicians I.  Hospitalizations, surgeries, and ER visits in previous 12 months J.  [place] such as hearing and vision if needed, cognitive and depression L. Referrals and appointments   In addition, I have reviewed and discussed with patient certain preventive protocols, quality metrics, and best practice recommendations. A written personalized care plan for preventive services as well as general preventive health recommendations were provided to patient. Nurse Health Advisor  Signed,    Lillee Mooneyhan [place], [place]  03/10/6212 Nurse Health  Advisor   Nurse Notes: ***

## 2019-03-16 ENCOUNTER — Encounter: Payer: Medicare Other | Admitting: Family Medicine

## 2019-03-16 ENCOUNTER — Ambulatory Visit: Payer: Medicare Other

## 2019-03-16 ENCOUNTER — Inpatient Hospital Stay
Admission: EM | Admit: 2019-03-16 | Discharge: 2019-03-19 | DRG: 689 | Disposition: A | Discharge status: Home Health | Payer: Medicare HMO | Attending: Internal Medicine | Admitting: Internal Medicine

## 2019-03-16 ENCOUNTER — Emergency Department: Payer: Medicare HMO

## 2019-03-16 DIAGNOSIS — Z7982 Long term (current) use of aspirin: Secondary | ICD-10-CM | POA: Diagnosis not present

## 2019-03-16 DIAGNOSIS — N183 Chronic kidney disease, stage 3 (moderate): Secondary | ICD-10-CM | POA: Diagnosis present

## 2019-03-16 DIAGNOSIS — R270 Ataxia, unspecified: Secondary | ICD-10-CM

## 2019-03-16 DIAGNOSIS — Z801 Family history of malignant neoplasm of trachea, bronchus and lung: Secondary | ICD-10-CM | POA: Diagnosis not present

## 2019-03-16 DIAGNOSIS — E7800 Pure hypercholesterolemia, unspecified: Secondary | ICD-10-CM | POA: Diagnosis present

## 2019-03-16 DIAGNOSIS — Z800 Family history of malignant neoplasm of digestive organs: Secondary | ICD-10-CM | POA: Diagnosis not present

## 2019-03-16 DIAGNOSIS — E039 Hypothyroidism, unspecified: Secondary | ICD-10-CM | POA: Diagnosis present

## 2019-03-16 DIAGNOSIS — H409 Unspecified glaucoma: Secondary | ICD-10-CM | POA: Diagnosis present

## 2019-03-16 DIAGNOSIS — N3000 Acute cystitis without hematuria: Secondary | ICD-10-CM | POA: Diagnosis present

## 2019-03-16 DIAGNOSIS — I129 Hypertensive chronic kidney disease with stage 1 through stage 4 chronic kidney disease, or unspecified chronic kidney disease: Secondary | ICD-10-CM | POA: Diagnosis present

## 2019-03-16 DIAGNOSIS — E860 Dehydration: Secondary | ICD-10-CM | POA: Diagnosis present

## 2019-03-16 DIAGNOSIS — Z79890 Hormone replacement therapy (postmenopausal): Secondary | ICD-10-CM | POA: Diagnosis not present

## 2019-03-16 DIAGNOSIS — Z9221 Personal history of antineoplastic chemotherapy: Secondary | ICD-10-CM | POA: Diagnosis not present

## 2019-03-16 DIAGNOSIS — G9341 Metabolic encephalopathy: Secondary | ICD-10-CM | POA: Diagnosis present

## 2019-03-16 DIAGNOSIS — B9620 Unspecified Escherichia coli [E. coli] as the cause of diseases classified elsewhere: Secondary | ICD-10-CM | POA: Diagnosis present

## 2019-03-16 DIAGNOSIS — Z8551 Personal history of malignant neoplasm of bladder: Secondary | ICD-10-CM | POA: Diagnosis not present

## 2019-03-16 DIAGNOSIS — Z79899 Other long term (current) drug therapy: Secondary | ICD-10-CM | POA: Diagnosis not present

## 2019-03-16 DIAGNOSIS — Z8249 Family history of ischemic heart disease and other diseases of the circulatory system: Secondary | ICD-10-CM | POA: Diagnosis not present

## 2019-03-16 DIAGNOSIS — N390 Urinary tract infection, site not specified: Secondary | ICD-10-CM | POA: Diagnosis not present

## 2019-03-16 DIAGNOSIS — I951 Orthostatic hypotension: Secondary | ICD-10-CM | POA: Diagnosis present

## 2019-03-16 DIAGNOSIS — I6502 Occlusion and stenosis of left vertebral artery: Secondary | ICD-10-CM | POA: Diagnosis not present

## 2019-03-16 DIAGNOSIS — Z8042 Family history of malignant neoplasm of prostate: Secondary | ICD-10-CM | POA: Diagnosis not present

## 2019-03-16 DIAGNOSIS — Z20828 Contact with and (suspected) exposure to other viral communicable diseases: Secondary | ICD-10-CM | POA: Diagnosis present

## 2019-03-16 DIAGNOSIS — Z87891 Personal history of nicotine dependence: Secondary | ICD-10-CM | POA: Diagnosis not present

## 2019-03-16 DIAGNOSIS — N179 Acute kidney failure, unspecified: Secondary | ICD-10-CM | POA: Diagnosis present

## 2019-03-16 DIAGNOSIS — I10 Essential (primary) hypertension: Secondary | ICD-10-CM | POA: Diagnosis not present

## 2019-03-16 LAB — BASIC METABOLIC PANEL
Anion gap: 12 (ref 5–15)
BUN: 66 mg/dL — ABNORMAL HIGH (ref 8–23)
CO2: 22 mmol/L (ref 22–32)
Calcium: 9 mg/dL (ref 8.9–10.3)
Chloride: 100 mmol/L (ref 98–111)
Creatinine, Ser: 1.77 mg/dL — ABNORMAL HIGH (ref 0.44–1.00)
GFR calc Af Amer: 29 mL/min — ABNORMAL LOW (ref >60)
GFR calc non Af Amer: 25 mL/min — ABNORMAL LOW (ref >60)
Glucose, Bld: 93 mg/dL (ref 70–99)
Potassium: 3.7 mmol/L (ref 3.5–5.1)
Sodium: 134 mmol/L — ABNORMAL LOW (ref 135–145)

## 2019-03-16 LAB — URINALYSIS, COMPLETE (UACMP) WITH MICROSCOPIC
Bilirubin Urine: NEGATIVE
Glucose, UA: NEGATIVE mg/dL
Ketones, ur: NEGATIVE mg/dL
Nitrite: POSITIVE — AB
Protein, ur: NEGATIVE mg/dL
Specific Gravity, Urine: 1.016 (ref 1.005–1.030)
pH: 5 (ref 5.0–8.0)

## 2019-03-16 LAB — TSH: TSH: 2.729 u[IU]/mL (ref 0.350–4.500)

## 2019-03-16 LAB — SARS CORONAVIRUS 2 BY RT PCR (HOSPITAL ORDER, PERFORMED IN ~~LOC~~ HOSPITAL LAB): SARS Coronavirus 2: NEGATIVE

## 2019-03-16 MED ORDER — CARVEDILOL 12.5 MG PO TABS
12.5000 mg | ORAL_TABLET | Freq: Two times a day (BID) | ORAL | Status: DC
  Administered 2019-03-16 – 2019-03-18 (×5): 12.5 mg via ORAL
  Filled 2019-03-16 (×2): qty 1
  Filled 2019-03-16: qty 2
  Filled 2019-03-16 (×3): qty 1

## 2019-03-16 MED ORDER — HYDROCHLOROTHIAZIDE 25 MG PO TABS: 25.0000 mg | ORAL_TABLET | Freq: Every day | ORAL | Status: DC

## 2019-03-16 MED ORDER — DORZOLAMIDE HCL-TIMOLOL MAL 2-0.5 % OP SOLN
1.0000 [drp] | Freq: Two times a day (BID) | OPHTHALMIC | Status: DC
  Administered 2019-03-16 – 2019-03-19 (×7): 1 [drp] via OPHTHALMIC
  Filled 2019-03-16: qty 10

## 2019-03-16 MED ORDER — ONDANSETRON HCL 4 MG PO TABS
4.0000 mg | ORAL_TABLET | Freq: Four times a day (QID) | ORAL | Status: DC | PRN
  Filled 2019-03-16: qty 1

## 2019-03-16 MED ORDER — IRBESARTAN 75 MG PO TABS: 37.5000 mg | ORAL_TABLET | Freq: Every day | ORAL | Status: DC

## 2019-03-16 MED ORDER — ACETAMINOPHEN 325 MG PO TABS: 650.0000 mg | ORAL_TABLET | Freq: Four times a day (QID) | ORAL | Status: DC | PRN

## 2019-03-16 MED ORDER — VITAMIN D 25 MCG (1000 UNIT) PO TABS
1000.0000 [IU] | ORAL_TABLET | Freq: Two times a day (BID) | ORAL | Status: DC
  Administered 2019-03-16 – 2019-03-19 (×7): 1000 [IU] via ORAL
  Filled 2019-03-16 (×10): qty 1

## 2019-03-16 MED ORDER — ENOXAPARIN SODIUM 40 MG/0.4ML ~~LOC~~ SOLN
40.0000 mg | SUBCUTANEOUS | Status: DC
  Filled 2019-03-16: qty 0.4

## 2019-03-16 MED ORDER — INFLUENZA VAC A&B SA ADJ QUAD 0.5 ML IM PRSY
0.5000 mL | PREFILLED_SYRINGE | INTRAMUSCULAR | Status: DC
  Filled 2019-03-16: qty 0.5

## 2019-03-16 MED ORDER — NETARSUDIL-LATANOPROST 0.02-0.005 % OP SOLN
1.0000 [drp] | Freq: Every evening | OPHTHALMIC | Status: DC
  Administered 2019-03-16 – 2019-03-18 (×3): 1 [drp] via OPHTHALMIC
  Filled 2019-03-16: qty 1

## 2019-03-16 MED ORDER — GADOBUTROL 1 MMOL/ML IV SOLN
5.0000 mL | Freq: Once | INTRAVENOUS | Status: AC | PRN
  Administered 2019-03-16: 04:00:00 5 mL via INTRAVENOUS

## 2019-03-16 MED ORDER — SODIUM CHLORIDE 0.9 % IV SOLN
1.0000 g | Freq: Once | INTRAVENOUS | Status: AC
  Administered 2019-03-16: 03:00:00 1 g via INTRAVENOUS
  Filled 2019-03-16: qty 10

## 2019-03-16 MED ORDER — ASPIRIN EC 81 MG PO TBEC
81.0000 mg | DELAYED_RELEASE_TABLET | Freq: Every day | ORAL | Status: DC
  Administered 2019-03-16 – 2019-03-19 (×4): 81 mg via ORAL
  Filled 2019-03-16 (×4): qty 1

## 2019-03-16 MED ORDER — ADULT MULTIVITAMIN W/MINERALS CH
1.0000 | ORAL_TABLET | Freq: Every day | ORAL | Status: DC
  Administered 2019-03-16 – 2019-03-19 (×4): 1 via ORAL
  Filled 2019-03-16 (×4): qty 1

## 2019-03-16 MED ORDER — BRIMONIDINE TARTRATE 0.2 % OP SOLN
1.0000 [drp] | Freq: Two times a day (BID) | OPHTHALMIC | Status: DC
  Administered 2019-03-16 – 2019-03-19 (×7): 1 [drp] via OPHTHALMIC
  Filled 2019-03-16: qty 5

## 2019-03-16 MED ORDER — MEMANTINE HCL 5 MG PO TABS
5.0000 mg | ORAL_TABLET | Freq: Two times a day (BID) | ORAL | Status: DC
  Administered 2019-03-16 – 2019-03-19 (×7): 5 mg via ORAL
  Filled 2019-03-16 (×8): qty 1

## 2019-03-16 MED ORDER — SODIUM CHLORIDE 0.9 % IV SOLN
INTRAVENOUS | Status: DC
  Administered 2019-03-16 – 2019-03-17 (×3): via INTRAVENOUS

## 2019-03-16 MED ORDER — MAGNESIUM HYDROXIDE 400 MG/5ML PO SUSP: 30.0000 mL | Freq: Every day | ORAL | Status: DC | PRN

## 2019-03-16 MED ORDER — OMEGA-3-ACID ETHYL ESTERS 1 G PO CAPS
1.0000 g | ORAL_CAPSULE | Freq: Two times a day (BID) | ORAL | Status: DC
  Administered 2019-03-16 – 2019-03-19 (×7): 1 g via ORAL
  Filled 2019-03-16 (×8): qty 1

## 2019-03-16 MED ORDER — ACETAMINOPHEN 650 MG RE SUPP
650.0000 mg | Freq: Four times a day (QID) | RECTAL | Status: DC | PRN
  Filled 2019-03-16: qty 1

## 2019-03-16 MED ORDER — HYDRALAZINE HCL 20 MG/ML IJ SOLN
10.0000 mg | Freq: Four times a day (QID) | INTRAMUSCULAR | Status: DC | PRN
  Filled 2019-03-16: qty 0.5

## 2019-03-16 MED ORDER — SODIUM CHLORIDE 0.9 % IV SOLN
1.0000 g | INTRAVENOUS | Status: DC
  Administered 2019-03-17 – 2019-03-18 (×2): 1 g via INTRAVENOUS
  Filled 2019-03-16 (×2): qty 10
  Filled 2019-03-16: qty 1

## 2019-03-16 MED ORDER — CALCIUM CARBONATE-VITAMIN D 500-200 MG-UNIT PO TABS
1.0000 | ORAL_TABLET | Freq: Every day | ORAL | Status: DC
  Administered 2019-03-17 – 2019-03-19 (×3): 1 via ORAL
  Filled 2019-03-16 (×3): qty 1

## 2019-03-16 MED ORDER — ONDANSETRON HCL 4 MG/2ML IJ SOLN: 4.0000 mg | Freq: Four times a day (QID) | INTRAMUSCULAR | Status: DC | PRN

## 2019-03-16 MED ORDER — FISH OIL + D3 1000-1000 MG-UNIT PO CAPS: ORAL_CAPSULE | Freq: Two times a day (BID) | ORAL | Status: DC

## 2019-03-16 MED ORDER — ENOXAPARIN SODIUM 30 MG/0.3ML ~~LOC~~ SOLN
30.0000 mg | SUBCUTANEOUS | Status: DC
  Administered 2019-03-16 – 2019-03-18 (×3): 30 mg via SUBCUTANEOUS
  Filled 2019-03-16 (×3): qty 0.3

## 2019-03-16 MED ORDER — SODIUM CHLORIDE 0.9 % IV BOLUS
500.0000 mL | Freq: Once | INTRAVENOUS | Status: AC
  Administered 2019-03-16: 02:00:00 500 mL via INTRAVENOUS

## 2019-03-16 NOTE — ED Notes (Signed)
Patient transported to MRI at this time with [place], EDT.

## 2019-03-16 NOTE — ED Notes (Signed)
Pt is HIGH FALL RISK. [place] bed alarm activated, yellow bracelet placed on right wrist, and yellow slip resistant socks applied at this time.

## 2019-03-16 NOTE — ED Notes (Signed)
Pt given breakfast tray

## 2019-03-16 NOTE — ED Provider Notes (Signed)
[facility] Emergency Department Provider Note   First MD Initiated Contact with Patient 03/16/19 0530     (approximate)  I have reviewed the triage vital signs and the nursing notes.   HISTORY  Chief Complaint Dizziness   HPI Erin Ibarra is a 85 y.o. female with below list of previous medical conditions presents to the emergency department secondary to dizziness unsteady gait and episodes of confusion today.  Patient states that she fell earlier today but denied any injury from the fall.  Patient denies any chest pain no shortness of breath.  Patient denies any fever.  Patient denies any headache nausea or vomiting.  Patient denies any diarrhea or constipation.        Past Medical History:  Diagnosis Date   Bladder cancer ([place]) 1991   Hx TUR and chemo tx's.    Chronic kidney disease    Glaucoma    Heart murmur    Hypertension    Renal insufficiency    Hx Stage 3 kidney disease.   Thyroid disease     Patient Active Problem List   Diagnosis Date Noted   UTI (urinary tract infection) 03/16/2019   MCI (mild cognitive impairment) with memory loss 12/23/2018   Benign hypertensive heart and CKD, stage 3 (GFR 30-59), w CHF ([place]) 09/09/2015   Hyperkalemia 09/09/2015   Amblyopia 09/06/2015   Back ache 09/06/2015   Aortic valve, bicuspid 09/06/2015   Bladder cancer ([place]) 09/06/2015   Colon polyp 09/06/2015   DD (diverticular disease) 09/06/2015   Breathlessness on exertion 09/06/2015   Hypercholesteremia 09/06/2015   Below normal amount of sodium in the blood 09/06/2015   Cervical pain 09/06/2015   Amnesia 09/06/2015   Post menopausal syndrome 09/06/2015   Disorder of scalp 09/06/2015   Hypo-osmolality and hyponatremia 09/06/2015   Diverticulitis 09/06/2015   Aortic regurgitation, congenital 09/06/2015   OP (osteoporosis) 05/20/2015   Age-related osteoporosis without current pathological fracture  05/20/2015   Hypothyroidism 12/26/2014   Encounter for screening colonoscopy 08/29/2014   BP (high blood pressure) 02/03/2013   Difficulty hearing 02/03/2013   Cataract 10/14/2012   Central retinal vein occlusion 10/14/2012   Neovascular glaucoma 10/14/2012   Cardiac failure ([place]) 06/21/2008   Heart failure ([place]) 06/21/2008    Past Surgical History:  Procedure Laterality Date   broke knee cap     CATARACT EXTRACTION  2015   COLONOSCOPY  2006   TRANSURETHRAL RESECTION OF BLADDER  1991    Prior to Admission medications   Medication Sig Start Date End Date Taking? Authorizing Provider  aspirin 81 MG tablet Take 81 mg by mouth daily.   Yes [provider]  brimonidine (ALPHAGAN) 0.2 % ophthalmic solution Place 1 drop into the right eye 2 (two) times daily.  02/24/17  Yes [provider]  Calcium Carbonate-Vit D-Min (CALCIUM 600 + MINERALS PO) Take 1 tablet by mouth daily.   Yes [provider]  carvedilol (COREG) 12.5 MG tablet Take 12.5 mg by mouth 2 (two) times daily.  08/17/14  Yes [provider]  dorzolamide-timolol (COSOPT) 22.3-6.8 MG/ML ophthalmic solution Place 1 drop into both eyes 2 (two) times daily.  08/19/15  Yes [provider]  Fish Oil-Cholecalciferol (FISH OIL + D3) 1000-1000 MG-UNIT CAPS Take by mouth 2 (two) times daily.   Yes [provider]  hydrochlorothiazide (HYDRODIURIL) 25 MG tablet Take 1 tablet (25 mg total) by mouth daily. 09/25/16  Yes Jerrol Banana., MD  levothyroxine Wil[place] Floor,  LEVOTHROID) 75 MCG tablet Take 1 tablet (75 mcg total) by mouth daily before breakfast. 07/11/18  Yes Jerrol Banana., MD  memantine (NAMENDA) 5 MG tablet TAKE 1 TABLET BY MOUTH TWICE A DAY Patient taking differently: Take 5 mg by mouth 2 (two) times daily.  03/10/19  Yes Jerrol Banana., MD  Multiple Vitamins-Minerals (MULTIVITAMIN WITH MINERALS) tablet Take 1 tablet by mouth daily.   Yes [provider]  ROCKLATAN 0.02-0.005 % SOLN Place 1 drop into the right eye every evening. 12/26/18  Yes [provider]  valsartan (DIOVAN) 80 MG tablet Take 40 mg by mouth daily.  07/23/14  Yes [provider]    Allergies Patient has no known allergies.  Family History  Problem Relation Age of Onset   Prostate cancer Father    Cancer Father        prostate   Heart disease Father        MI   Pancreatic cancer Brother    Lung cancer Brother    Cancer Brother        brain, lung   Heart disease Brother        MI    Social History Social History   Tobacco Use   Smoking status: Former Smoker    Packs/day: 1.00    Years: 15.00    Pack years: 15.00    Types: Cigarettes   Smokeless tobacco: Never Used   Tobacco comment: > 15 years ago  Substance Use Topics   Alcohol use: Yes    Alcohol/week: 7.0 standard drinks    Types: 7 Glasses of wine per week    Comment: 1 glass of wine qhs   Drug use: No    Review of Systems Constitutional: No fever/chills Eyes: No visual changes. ENT: No sore throat. Cardiovascular: Denies chest pain. Respiratory: Denies shortness of breath. Gastrointestinal: No abdominal pain.  No nausea, no vomiting.  No diarrhea.  No constipation. Genitourinary: Negative for dysuria. Musculoskeletal: Negative for neck pain.  Negative for back pain. Integumentary: Negative for rash. Neurological: Positive for dizziness   ____________________________________________   PHYSICAL EXAM:  VITAL SIGNS: ED Triage Vitals  Enc Vitals Group     BP 03/16/19 0113 (!) 179/84     Pulse Rate 03/16/19 0113 72     Resp 03/16/19 0113 18     Temp --      Temp src --      SpO2 03/16/19 0113 95 %     Weight --      Height 03/15/19 2206 1.575 m (5\' 2" )     Head Circumference --      Peak Flow --      Pain Score 03/15/19 2205 0     Pain Loc --      Pain Edu? --      Excl. in [place]? --     Constitutional: Alert and oriented.  Eyes:  Conjunctivae are normal.  Head: Atraumatic. Mouth/Throat: Mucous membranes are moist. Neck: No stridor.  No meningeal signs.   Cardiovascular: Normal rate, regular rhythm. Good peripheral circulation. Grossly normal heart sounds. Respiratory: Normal respiratory effort.  No retractions. Gastrointestinal: Soft and nontender. No distention.  Musculoskeletal: No lower extremity tenderness nor edema. No gross deformities of extremities. Neurologic:  Normal speech and language. No gross focal neurologic deficits are appreciated.  Skin:  Skin is warm, dry and intact. Psychiatric: Mood and affect are normal. Speech and behavior are normal.  ____________________________________________   LABS (  all labs ordered are listed, but only abnormal results are displayed)  Labs Reviewed  CBC WITH DIFFERENTIAL/PLATELET - Abnormal; Notable for the following components:      Result Value   WBC 11.1 (*)    RBC 3.74 (*)    Monocytes Absolute 1.1 (*)    All other components within normal limits  COMPREHENSIVE METABOLIC PANEL - Abnormal; Notable for the following components:   Sodium 133 (*)    Chloride 97 (*)    Glucose, Bld 112 (*)    BUN 69 (*)    Creatinine, Ser 1.99 (*)    GFR calc non Af Amer 22 (*)    GFR calc Af Amer 26 (*)    All other components within normal limits  URINALYSIS, COMPLETE (UACMP) WITH MICROSCOPIC - Abnormal; Notable for the following components:   Color, Urine YELLOW (*)    APPearance CLOUDY (*)    Hgb urine dipstick SMALL (*)    Nitrite POSITIVE (*)    Leukocytes,Ua SMALL (*)    Bacteria, UA MANY (*)    All other components within normal limits  SARS CORONAVIRUS 2 (HOSPITAL ORDER, [facility])  URINE CULTURE  TROPONIN I (HIGH SENSITIVITY)   ____________________________________________  EKG  ED ECG REPORT I, [place]acre N Ahmaad Neidhardt, the attending physician, personally viewed and interpreted this ECG.   Date: 03/15/2019  EKG Time: 10:09 PM   Rate: 64  Rhythm: Normal sinus rhythm with a left bundle branch block  Axis: Normal  Intervals: Normal  ST&T Change: None  ____________________________________________  RADIOLOGY I, Loma N Eleanora Guinyard, personally viewed and evaluated these images (plain radiographs) as part of my medical decision making, as well as reviewing the written report by the radiologist.  ED MD interpretation: CT head MRI brain with and without contrast and MRI of the neck revealed no acute findings.  Official radiology report(s): Ct Head Wo Contrast  Result Date: 03/15/2019 CLINICAL DATA:  85 year old female with confusion. EXAM: CT HEAD WITHOUT CONTRAST TECHNIQUE: Contiguous axial images were obtained from the base of the skull through the vertex without intravenous contrast. COMPARISON:  Brain MRI dated 11/19/2017 FINDINGS: Brain: There is mild age-related atrophy and chronic microvascular ischemic changes. There is no acute intracranial hemorrhage. No mass effect or midline shift. No extra-axial fluid collection. Vascular: No hyperdense vessel or unexpected calcification. Skull: Normal. Negative for fracture or focal lesion. Sinuses/Orbits: No acute finding. Other: None IMPRESSION: 1. No acute intracranial hemorrhage. 2. Age-related atrophy and chronic microvascular ischemic changes. Electronically Signed   By: Anner Crete M.D.   On: 03/15/2019 22:42   Mr Angiogram Neck W Or Wo Contrast  Result Date: 03/16/2019 CLINICAL DATA:  Arterial structures/occlusion in the head or neck. Dizziness. EXAM: MRA NECK WITHOUT AND WITH CONTRAST TECHNIQUE: Multiplanar and multiecho pulse sequences of the neck were obtained without and with intravenous contrast. Angiographic images of the neck were obtained using MRA technique without and with intravenous contrast. CONTRAST:  61mL GADAVIST GADOBUTROL 1 MMOL/ML IV SOLN COMPARISON:  None. FINDINGS: Time-of-flight shows antegrade flow in the carotid and vertebral circulation on both  sides were covered. Postcontrast imaging shows normal diameter arch with 3 vessel branching. Cervical carotids are sufficiently patent throughout with mild tortuosity. Dominant right vertebral artery which is widely patent to the basilar. Mild stenosis is seen at the proximal left vertebral artery and V4 segment, the former may be accentuated by artifact. No proximal subclavian stenosis. Aplastic right A1 segment. Otherwise unremarkable intracranial vasculature. IMPRESSION: 1.  Mild narrowing of the non dominant left vertebral artery. No evident vertebrobasilar insufficiency to explain dizziness. 2. Widely patent carotid circulation. Electronically Signed   By: Monte Fantasia M.D.   On: 03/16/2019 04:58   Mr Brain W And Wo Contrast  Result Date: 03/16/2019 CLINICAL DATA:  Ataxia with stroke suspected. EXAM: MRI HEAD WITHOUT AND WITH CONTRAST TECHNIQUE: Multiplanar, multiecho pulse sequences of the brain and surrounding structures were obtained without and with intravenous contrast. CONTRAST:  56mL GADAVIST GADOBUTROL 1 MMOL/ML IV SOLN COMPARISON:  Head CT from earlier today.  Brain MRI 11/19/2017 FINDINGS: Brain: No acute infarction, hemorrhage, hydrocephalus, extra-axial collection or mass lesion. FLAIR hyperintensity to a moderate degree in the pons compatible with chronic small vessel ischemia. Milder small-vessel ischemic change in the supratentorial white matter, asymmetric to the left corona radiata. No significant change from prior. Cerebral volume loss that is central predominant with mild progression from 2014 by interventricular measurement, stable from most recent prior. No abnormal intracranial enhancement. Vascular: Major flow voids are preserved. Skull and upper cervical spine: Negative for marrow lesion Sinuses/Orbits: Right cataract resection. IMPRESSION: 1. No acute finding. 2. Chronic small vessel ischemia and cerebral volume loss without interval change. Electronically Signed   By: Monte Fantasia M.D.   On: 03/16/2019 04:53     Procedures   ____________________________________________   INITIAL IMPRESSION / MDM / ASSESSMENT AND PLAN / ED COURSE  As part of my medical decision making, I reviewed the following data within the [place] NUMBER   85 year old female presented with above-stated history and physical exam presenting concern for possible intracranial pathology including posterior circulation pathology.  Also considered possibility of infectious etiology including urinary tract infection.  CT scan and MRI of the brain and MRI of the neck revealed no acute pathology urinalysis consistent with a urinary tract infection.  Given unsteady gait confusion in the setting of urinary tract infection patient discussed with Dr. Sidney Ace for hospital admission for further evaluation and management.  ____________________________________________  FINAL CLINICAL IMPRESSION(S) / ED DIAGNOSES  Final diagnoses:  Acute cystitis without hematuria  Ataxia     MEDICATIONS GIVEN DURING THIS VISIT:  Medications  aspirin EC tablet 81 mg (has no administration in time range)  carvedilol (COREG) tablet 12.5 mg (has no administration in time range)  memantine (NAMENDA) tablet 5 mg (has no administration in time range)  Calcium 600 + Minerals 600-200 MG-UNIT TABS (has no administration in time range)  Fish Oil + D3 1000-1000 MG-UNIT CAPS (has no administration in time range)  brimonidine (ALPHAGAN) 0.2 % ophthalmic solution 1 drop (has no administration in time range)  multivitamin with minerals tablet 1 tablet (has no administration in time range)  dorzolamide-timolol (COSOPT) 22.3-6.8 MG/ML ophthalmic solution 1 drop (has no administration in time range)  Netarsudil-Latanoprost 0.02-0.005 % SOLN 1 drop (has no administration in time range)  enoxaparin (LOVENOX) injection 40 mg (has no administration in time range)  0.9 %  sodium chloride infusion (has no administration in time  range)  acetaminophen (TYLENOL) tablet 650 mg (has no administration in time range)    Or  acetaminophen (TYLENOL) suppository 650 mg (has no administration in time range)  magnesium hydroxide (MILK OF MAGNESIA) suspension 30 mL (has no administration in time range)  ondansetron (ZOFRAN) tablet 4 mg (has no administration in time range)    Or  ondansetron (ZOFRAN) injection 4 mg (has no administration in time range)  cefTRIAXone (ROCEPHIN) 1 g in sodium chloride 0.9 % 100  mL IVPB (has no administration in time range)  sodium chloride 0.9 % bolus 500 mL (0 mLs Intravenous Stopped 03/16/19 0236)  cefTRIAXone (ROCEPHIN) 1 g in sodium chloride 0.9 % 100 mL IVPB (0 g Intravenous Stopped 03/16/19 0421)  gadobutrol (GADAVIST) 1 MMOL/ML injection 5 mL (5 mLs Intravenous Contrast Given 03/16/19 0424)     ED Discharge Orders    None      *Please note:  Shantrell Placzek was evaluated in Emergency Department on 03/16/2019 for the symptoms described in the history of present illness. She was evaluated in the context of the global COVID-19 pandemic, which necessitated consideration that the patient might be at risk for infection with the SARS-CoV-2 virus that causes COVID-19. Institutional protocols and algorithms that pertain to the evaluation of patients at risk for COVID-19 are in a state of rapid change based on information released by regulatory bodies including the CDC and federal and state organizations. These policies and algorithms were followed during the patient's care in the ED.  Some ED evaluations and interventions may be delayed as a result of limited staffing during the pandemic.*  Note:  This document was prepared using Dragon voice recognition software and may include unintentional dictation errors.   Gregor Hams, MD 03/16/19 [phone]

## 2019-03-16 NOTE — ED Notes (Signed)
Pt changed to hospital bed and taken to restroom with one person assist.

## 2019-03-16 NOTE — ED Notes (Signed)
Patient ambulatory to use toilet with one person assist. Patient repositioned in bed and covered with warm blanket.

## 2019-03-16 NOTE — H&P (Signed)
[facility] [place] NAME: Erin Ibarra    MR#:  [id number]  DATE OF BIRTH:  08/05/1929  DATE OF ADMISSION:  03/16/2019  PRIMARY CARE PHYSICIAN: Erin Ibarra., MD   REQUESTING/REFERRING PHYSICIAN: Marjean Donna, MD  CHIEF COMPLAINT:   Chief Complaint  Patient presents with   Dizziness    HISTORY OF PRESENT ILLNESS:  Erin Ibarra  is a 85 y.o. Caucasian pleasant female with a known history of multiple medical problems that are mentioned below including hypertension and stage III chronic kidney disease, who presented to the emergency room with acute onset of altered mental status with confusion that started this morning.  She admitted to mild headache and dizziness.  She has been having urinary frequency and urgency as well as dysuria.  No fever or chills.  No nausea vomiting or abdominal pain.  She denied chest pain or palpitation.  No cough or wheezing or shortness of breath.  She is a fairly poor historian.  She denies any paresthesias or focal muscle weakness.  Upon presentation to the emergency room, blood pressure was 170/81 with a pulse 69 respiratory rate 18 pulse extremity 97% on room air.  Labs revealed UA with 11-20 WBCs with many bacteria and hyaline casts.  Urine culture was sent.  Head CT without contrast revealed chronic microvascular ischemic disease with age-appropriate atrophy without acute findings.  Brain MRI without contrast tranilast revealed Chronic small vessel ischemic changes as well as cerebral volume loss without interval change and no acute findings.  Neck MRA revealed mild narrowing of the nondominant left vertebral artery with no evident vertebrobasilar insufficiency to explain dizziness and widely patent carotid circulation.  Labs were remarkable for BUN of 69 and creatinine 1.9 up from a year ago and CBC showed hemoglobin of 12 hematocrit 36.2 slightly lower than then.  The patient was given a gram of IV  Rocephin as well as 500 mill IV normal saline bolus.  She is admitted to the medical monitored bed for further evaluation and management PAST MEDICAL HISTORY:   Past Medical History:  Diagnosis Date   Bladder cancer ([place]) 1991   Hx TUR and chemo tx's.    Chronic kidney disease    Glaucoma    Heart murmur    Hypertension    Renal insufficiency    Hx Stage 3 kidney disease.   Thyroid disease     PAST SURGICAL HISTORY:   Past Surgical History:  Procedure Laterality Date   broke knee cap     CATARACT EXTRACTION  2015   COLONOSCOPY  2006   TRANSURETHRAL RESECTION OF BLADDER  1991    SOCIAL HISTORY:   Social History   Tobacco Use   Smoking status: Former Smoker    Packs/day: 1.00    Years: 15.00    Pack years: 15.00    Types: Cigarettes   Smokeless tobacco: Never Used   Tobacco comment: > 15 years ago  Substance Use Topics   Alcohol use: Yes    Alcohol/week: 7.0 standard drinks    Types: 7 Glasses of wine per week    Comment: 1 glass of wine qhs    FAMILY HISTORY:   Family History  Problem Relation Age of Onset   Prostate cancer Father    Cancer Father        prostate   Heart disease Father        MI   Pancreatic cancer Brother  Lung cancer Brother    Cancer Brother        brain, lung   Heart disease Brother        MI    DRUG ALLERGIES:  No Known Allergies  REVIEW OF SYSTEMS:   ROS As per history of present illness. All pertinent systems were reviewed above. Constitutional,  HEENT, cardiovascular, respiratory, GI, GU, musculoskeletal, neuro, psychiatric, endocrine,  integumentary and hematologic systems were reviewed and are otherwise  negative/unremarkable except for positive findings mentioned above in the HPI.   MEDICATIONS AT HOME:   Prior to Admission medications   Medication Sig Start Date End Date Taking? Authorizing Provider  aspirin 81 MG tablet Take 81 mg by mouth daily.   Yes [provider]    brimonidine (ALPHAGAN) 0.2 % ophthalmic solution Place 1 drop into the right eye 2 (two) times daily.  02/24/17  Yes [provider]  Calcium Carbonate-Vit D-Min (CALCIUM 600 + MINERALS PO) Take 1 tablet by mouth daily.   Yes [provider]  carvedilol (COREG) 12.5 MG tablet Take 12.5 mg by mouth 2 (two) times daily.  08/17/14  Yes [provider]  dorzolamide-timolol (COSOPT) 22.3-6.8 MG/ML ophthalmic solution Place 1 drop into both eyes 2 (two) times daily.  08/19/15  Yes [provider]  Fish Oil-Cholecalciferol (FISH OIL + D3) 1000-1000 MG-UNIT CAPS Take by mouth 2 (two) times daily.   Yes [provider]  hydrochlorothiazide (HYDRODIURIL) 25 MG tablet Take 1 tablet (25 mg total) by mouth daily. 09/25/16  Yes Erin Ibarra., MD  levothyroxine (SYNTHROID, LEVOTHROID) 75 MCG tablet Take 1 tablet (75 mcg total) by mouth daily before breakfast. 07/11/18  Yes Erin Ibarra., MD  memantine (NAMENDA) 5 MG tablet TAKE 1 TABLET BY MOUTH TWICE A DAY Patient taking differently: Take 5 mg by mouth 2 (two) times daily.  03/10/19  Yes Erin Ibarra., MD  Multiple Vitamins-Minerals (MULTIVITAMIN WITH MINERALS) tablet Take 1 tablet by mouth daily.   Yes [provider]  ROCKLATAN 0.02-0.005 % SOLN Place 1 drop into the right eye every evening. 12/26/18  Yes [provider]  valsartan (DIOVAN) 80 MG tablet Take 40 mg by mouth daily.  07/23/14  Yes [provider]      VITAL SIGNS:  Blood pressure (!) 166/74, pulse 71, resp. rate 16, height 5\' 2"  (1.575 m), SpO2 94 %.  PHYSICAL EXAMINATION:  Physical Exam  GENERAL:  85 y.o.-year-old Caucasian female patient lying in the bed with no acute distress.  EYES: Pupils equal, round, reactive to light and accommodation. No scleral icterus. Extraocular muscles intact.  HEENT: Head atraumatic, normocephalic. Oropharynx and nasopharynx clear.  NECK:  Supple, no jugular venous  distention. No thyroid enlargement, no tenderness.  LUNGS: Normal breath sounds bilaterally, no wheezing, rales,rhonchi or crepitation. No use of accessory muscles of respiration.  CARDIOVASCULAR: Regular rate and rhythm, S1, S2 normal. No murmurs, rubs, or gallops.  ABDOMEN: Soft, nondistended, nontender. Bowel sounds present. No organomegaly or mass.  EXTREMITIES: No pedal edema, cyanosis, or clubbing.  NEUROLOGIC: Cranial nerves II through XII are intact. Muscle strength 5/5 in all extremities. Sensation intact. Gait not checked.  PSYCHIATRIC: The patient is alert and oriented x 3.  Normal affect and good eye contact. SKIN: No obvious rash, lesion, or ulcer.   LABORATORY PANEL:   CBC Recent Labs  Lab 03/15/19 2209  WBC 11.1*  HGB 12.0  HCT 36.2  PLT 312   ------------------------------------------------------------------------------------------------------------------  Chemistries  Recent Labs  Lab 03/15/19 2209  NA 133*  K 4.2  CL 97*  CO2 22  GLUCOSE 112*  BUN 69*  CREATININE 1.99*  CALCIUM 9.9  AST 18  ALT 15  ALKPHOS 60  BILITOT 0.7   ------------------------------------------------------------------------------------------------------------------  Cardiac Enzymes No results for input(s): TROPONINI in the last 168 hours. ------------------------------------------------------------------------------------------------------------------  RADIOLOGY:  Ct Head Wo Contrast  Result Date: 03/15/2019 CLINICAL DATA:  85 year old female with confusion. EXAM: CT HEAD WITHOUT CONTRAST TECHNIQUE: Contiguous axial images were obtained from the base of the skull through the vertex without intravenous contrast. COMPARISON:  Brain MRI dated 11/19/2017 FINDINGS: Brain: There is mild age-related atrophy and chronic microvascular ischemic changes. There is no acute intracranial hemorrhage. No mass effect or midline shift. No extra-axial fluid collection. Vascular: No hyperdense vessel  or unexpected calcification. Skull: Normal. Negative for fracture or focal lesion. Sinuses/Orbits: No acute finding. Other: None IMPRESSION: 1. No acute intracranial hemorrhage. 2. Age-related atrophy and chronic microvascular ischemic changes. Electronically Signed   By: Anner Crete M.D.   On: 03/15/2019 22:42   Mr Angiogram Neck W Or Wo Contrast  Result Date: 03/16/2019 CLINICAL DATA:  Arterial structures/occlusion in the head or neck. Dizziness. EXAM: MRA NECK WITHOUT AND WITH CONTRAST TECHNIQUE: Multiplanar and multiecho pulse sequences of the neck were obtained without and with intravenous contrast. Angiographic images of the neck were obtained using MRA technique without and with intravenous contrast. CONTRAST:  44mL GADAVIST GADOBUTROL 1 MMOL/ML IV SOLN COMPARISON:  None. FINDINGS: Time-of-flight shows antegrade flow in the carotid and vertebral circulation on both sides were covered. Postcontrast imaging shows normal diameter arch with 3 vessel branching. Cervical carotids are sufficiently patent throughout with mild tortuosity. Dominant right vertebral artery which is widely patent to the basilar. Mild stenosis is seen at the proximal left vertebral artery and V4 segment, the former may be accentuated by artifact. No proximal subclavian stenosis. Aplastic right A1 segment. Otherwise unremarkable intracranial vasculature. IMPRESSION: 1. Mild narrowing of the non dominant left vertebral artery. No evident vertebrobasilar insufficiency to explain dizziness. 2. Widely patent carotid circulation. Electronically Signed   By: Monte Fantasia M.D.   On: 03/16/2019 04:58   Mr Brain W And Wo Contrast  Result Date: 03/16/2019 CLINICAL DATA:  Ataxia with stroke suspected. EXAM: MRI HEAD WITHOUT AND WITH CONTRAST TECHNIQUE: Multiplanar, multiecho pulse sequences of the brain and surrounding structures were obtained without and with intravenous contrast. CONTRAST:  73mL GADAVIST GADOBUTROL 1 MMOL/ML IV SOLN  COMPARISON:  Head CT from earlier today.  Brain MRI 11/19/2017 FINDINGS: Brain: No acute infarction, hemorrhage, hydrocephalus, extra-axial collection or mass lesion. FLAIR hyperintensity to a moderate degree in the pons compatible with chronic small vessel ischemia. Milder small-vessel ischemic change in the supratentorial white matter, asymmetric to the left corona radiata. No significant change from prior. Cerebral volume loss that is central predominant with mild progression from 2014 by interventricular measurement, stable from most recent prior. No abnormal intracranial enhancement. Vascular: Major flow voids are preserved. Skull and upper cervical spine: Negative for marrow lesion Sinuses/Orbits: Right cataract resection. IMPRESSION: 1. No acute finding. 2. Chronic small vessel ischemia and cerebral volume loss without interval change. Electronically Signed   By: Monte Fantasia M.D.   On: 03/16/2019 04:53      IMPRESSION AND PLAN:   1.  Metabolic encephalopathy like secondary to UTI.  Patient admitted to medical monitored bed.  We will follow neuro checks every 4 hours for 24 hours.  Will obtain urine  culture and sensitivity and continue her on IV Rocephin.  2.  Hypertension.  We will continue her Coreg and hold off her Diovan and HCTZ given acute kidney injury.  3.  Hypothyroidism.  We will continue her Synthroid and obtain TSH level.  4.  Glaucoma.  Her Alphagan will be resumed.  5.  DVT prophylaxis.  Subtenons Lovenox.   All the records are reviewed and case discussed with ED provider. The plan of care was discussed in details with the patient (and family). I answered all questions. The patient agreed to proceed with the above mentioned plan. Further management will depend upon hospital course.   CODE STATUS: Full code  TOTAL TIME TAKING CARE OF THIS PATIENT: 50 minutes.    Christel Mormon M.D on 03/16/2019 at 6:01 AM  Pager - [phone]  After 6pm go to [email or web address] -  password [email or web address] [facility]  [facility]  Office  [phone]  CC: Primary care physician; Erin Ibarra., MD   Note: This dictation was prepared with Dragon dictation along with smaller phrase technology. Any transcriptional errors that result from this process are unintentional.

## 2019-03-17 LAB — CBC
HCT: 31.9 % — ABNORMAL LOW (ref 36.0–46.0)
Hemoglobin: 10.5 g/dL — ABNORMAL LOW (ref 12.0–15.0)
MCH: 31.5 pg (ref 26.0–34.0)
MCHC: 32.9 g/dL (ref 30.0–36.0)
MCV: 95.8 fL (ref 80.0–100.0)
Platelets: 292 10*3/uL (ref 150–400)
RBC: 3.33 MIL/uL — ABNORMAL LOW (ref 3.87–5.11)
RDW: 13 % (ref 11.5–15.5)
WBC: 9.9 10*3/uL (ref 4.0–10.5)
nRBC: 0 % (ref 0.0–0.2)

## 2019-03-17 LAB — BASIC METABOLIC PANEL
Anion gap: 7 (ref 5–15)
BUN: 50 mg/dL — ABNORMAL HIGH (ref 8–23)
CO2: 24 mmol/L (ref 22–32)
Calcium: 7.9 mg/dL — ABNORMAL LOW (ref 8.9–10.3)
Chloride: 106 mmol/L (ref 98–111)
Creatinine, Ser: 1.45 mg/dL — ABNORMAL HIGH (ref 0.44–1.00)
GFR calc Af Amer: 37 mL/min — ABNORMAL LOW (ref >60)
GFR calc non Af Amer: 32 mL/min — ABNORMAL LOW (ref >60)
Glucose, Bld: 95 mg/dL (ref 70–99)
Potassium: 3.8 mmol/L (ref 3.5–5.1)
Sodium: 137 mmol/L (ref 135–145)

## 2019-03-17 MED ORDER — CEPHALEXIN 500 MG PO CAPS: 500.0000 mg | ORAL_CAPSULE | Freq: Three times a day (TID) | ORAL | 0 refills | Status: AC

## 2019-03-17 MED ORDER — SODIUM CHLORIDE 0.9 % IV SOLN
INTRAVENOUS | Status: DC
  Administered 2019-03-17: 18:00:00 via INTRAVENOUS

## 2019-03-17 MED ORDER — SODIUM CHLORIDE 0.9 % IV BOLUS: 500.0000 mL | Freq: Once | INTRAVENOUS | Status: DC

## 2019-03-17 MED ORDER — SODIUM CHLORIDE 0.9 % IV BOLUS
1000.0000 mL | Freq: Once | INTRAVENOUS | Status: AC
  Administered 2019-03-17: 15:00:00 1000 mL via INTRAVENOUS

## 2019-03-17 NOTE — TOC Initial Note (Signed)
Transition of Care [facility]) - Initial/Assessment Note    Patient Details  Name: Erin Ibarra MRN: [id number] Date of Birth: [birth year]  [facility] CM/SW Contact:    Annamaria Boots, [place] Phone Number: 03/17/2019, 3:53 PM  Clinical Narrative:   CSW consulted for home health. CSW met with patient and daughter Erin Ibarra in room. CSW explained recommendation of home health PT. Daughter reports that patient lives at [facility] and they would be using services with [facility] once she returns there. CSW will sign off. Please re consult if further needs arise.                Expected Discharge Plan: Home/Self Care     Patient Goals and CMS Choice   CMS Medicare.gov Compare Post Acute Care list provided to:: Patient Represenative (must comment)(Daughter) Choice offered to / list presented to : Adult Children  Expected Discharge Plan and Services Expected Discharge Plan: Home/Self Care         Expected Discharge Date: 03/17/19                                    Prior Living Arrangements/Services   Lives with:: Self Patient language and need for interpreter reviewed:: Yes Do you feel safe going back to the place where you live?: Yes      Need for Family Participation in Patient Care: Yes (Comment) Care giver support system in place?: Yes (comment)   Criminal Activity/Legal Involvement Pertinent to Current Situation/Hospitalization: No - Comment as needed  Activities of Daily Living Home Assistive Devices/Equipment: Walker (specify type) ADL Screening (condition at time of admission) Patient's cognitive ability adequate to safely complete daily activities?: Yes Is the patient deaf or have difficulty hearing?: No Does the patient have difficulty seeing, even when wearing glasses/contacts?: No Does the patient have difficulty concentrating, remembering, or making decisions?: No Patient able to express need for assistance with ADLs?: Yes Does the patient  have difficulty dressing or bathing?: No Independently performs ADLs?: Yes (appropriate for developmental age) Does the patient have difficulty walking or climbing stairs?: Yes Weakness of Legs: Both Weakness of Arms/Hands: None  Permission Sought/Granted Permission sought to share information with : Case Manager, Customer service manager, Family Supports Permission granted to share information with : Yes, Verbal Permission Granted              Emotional Assessment Appearance:: Appears stated age   Affect (typically observed): Accepting, Pleasant, Quiet Orientation: : Oriented to Self, Oriented to Place Alcohol / Substance Use: Not Applicable Psych Involvement: No (comment)  Admission diagnosis:  Ataxia [R27.0] Acute cystitis without hematuria [N30.00] Patient Active Problem List   Diagnosis Date Noted  . UTI (urinary tract infection) 03/16/2019  . MCI (mild cognitive impairment) with memory loss 12/23/2018  . Benign hypertensive heart and CKD, stage 3 (GFR 30-59), w CHF ([place]) 09/09/2015  . Hyperkalemia 09/09/2015  . Amblyopia 09/06/2015  . Back ache 09/06/2015  . Aortic valve, bicuspid 09/06/2015  . Bladder cancer ([place]) 09/06/2015  . Colon polyp 09/06/2015  . DD (diverticular disease) 09/06/2015  . Breathlessness on exertion 09/06/2015  . Hypercholesteremia 09/06/2015  . Below normal amount of sodium in the blood 09/06/2015  . Cervical pain 09/06/2015  . Amnesia 09/06/2015  . Post menopausal syndrome 09/06/2015  . Disorder of scalp 09/06/2015  . Hypo-osmolality and hyponatremia 09/06/2015  . Diverticulitis 09/06/2015  . Aortic regurgitation, congenital  09/06/2015  . OP (osteoporosis) 05/20/2015  . Age-related osteoporosis without current pathological fracture 05/20/2015  . Hypothyroidism 12/26/2014  . Encounter for screening colonoscopy 08/29/2014  . BP (high blood pressure) 02/03/2013  . Difficulty hearing 02/03/2013  . Cataract 10/14/2012  . Central  retinal vein occlusion 10/14/2012  . 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 Cardiac failure ([place]) 06/21/2008  . Heart failure ([place]) 06/21/2008   PCP:  Jerrol Banana., MD Pharmacy:   [id number] [place], [place]Phone: [phone]: [phone]    Social Determinants of Health (SDOH) Interventions    Readmission Risk Interventions No flowsheet data found.

## 2019-03-17 NOTE — Discharge Instructions (Addendum)
Resume diet and activity as before Stop all blood pressure medications (coreg, valsartan and hydrochlorothiazide)  If blood pressure consistently over 180 can stop the midodrine

## 2019-03-17 NOTE — Evaluation (Signed)
Physical Therapy Evaluation Patient Details Name: Markee Remlinger MRN: [id number] DOB: [birth year] Today's Date: 03/17/2019   History of Present Illness  presented to ER secondary to reports of dizziness; admitted for management of metabolic encephalopathy due to UTI.  Clinical Impression  Upon evaluation, patient alert and oriented; follows commands and demonstrates good effort with mobility tasks. Bilat UE/LE strength and ROM grossly symmetrical and WFL; no focal weakness noted.  Able to complete bed mobility with mod indep; sit/stand, basic transfers with RW, cga/close sup.  Does require UE support/assist for transfers and standing balance due to static/dynamic balance deficits. Reporting onset (and progressive worsening) of dizziness/lightheadedness with transition to upright; noted with significant orthostasis that does resolve with return to sitting/supine.  Additional gait/mobility deferred as result.  Will continue to assess/progress as medically appropriate. Would benefit from skilled PT to address above deficits and promote optimal return to PLOF; Recommend transition to [place] when medically appropriate upon discharge from acute hospitalization.      Follow Up Recommendations Home health PT    Equipment Recommendations       Recommendations for Other Services       Precautions / Restrictions Precautions Precautions: Fall Restrictions Weight Bearing Restrictions: No      Mobility  Bed Mobility Overal bed mobility: Modified Independent                Transfers Overall transfer level: Needs assistance Equipment used: Rolling walker (2 wheeled) Transfers: Sit to/from Stand Sit to Stand: Min guard         General transfer comment: tends to pull on RW for assist with lift off  Ambulation/Gait             General Gait Details: deferred due to symptomatic orthostasis  Stairs            Wheelchair Mobility    Modified Rankin (Stroke Patients  Only)       Balance Overall balance assessment: Needs assistance Sitting-balance support: No upper extremity supported;Feet supported Sitting balance-Leahy Scale: Good     Standing balance support: Bilateral upper extremity supported Standing balance-Leahy Scale: Fair Standing balance comment: does require UE support to maintain standing balance                             Pertinent Vitals/Pain Pain Assessment: No/denies pain    Home Living Family/patient expects to be discharged to:: [facility]) Living Arrangements: Alone Available Help at Discharge: Family;Available PRN/intermittently Type of Home: House Home Access: Level entry     Home Layout: One level Home Equipment: Walker - 2 wheels;Cane - single point      Prior Function Level of Independence: Independent with assistive device(s)         Comments: Mod indep with RW (for household) vs SPC (when outside, walking dog) for ADLs, household mobilization; does endorse multiple falls within previous six months, most recent last week     Hand Dominance        Extremity/Trunk Assessment   Upper Extremity Assessment Upper Extremity Assessment: Overall WFL for tasks assessed    Lower Extremity Assessment Lower Extremity Assessment: Overall WFL for tasks assessed(grossly at least 4/5 throughout)       Communication   Communication: No difficulties  Cognition Arousal/Alertness: Awake/alert Behavior During Therapy: WFL for tasks assessed/performed Overall Cognitive Status: Within Functional Limits for tasks assessed  General Comments      Exercises Other Exercises Other Exercises: Standing LE therex, 1x10, with RW, cga: heel raises, marching.  Attempting to promote BP support, unable to achieve (requiring return to sitting position for BP recovery)   Assessment/Plan    PT Assessment Patient needs continued PT services  PT  Problem List Decreased activity tolerance;Decreased balance;Decreased mobility;Decreased knowledge of use of DME;Decreased safety awareness;Decreased knowledge of precautions       PT Treatment Interventions DME instruction;Gait training;Functional mobility training;Therapeutic activities;Therapeutic exercise;Balance training;Patient/family education    PT Goals (Current goals can be found in the Care Plan section)  Acute Rehab PT Goals Patient Stated Goal: to return home when ready PT Goal Formulation: With patient/family Time For Goal Achievement: 03/31/19 Potential to Achieve Goals: Good    Frequency Min 2X/week   Barriers to discharge Decreased caregiver support      Co-evaluation               AM-PAC PT "6 Clicks" Mobility  Outcome Measure Help needed turning from your back to your side while in a flat bed without using bedrails?: None Help needed moving from lying on your back to sitting on the side of a flat bed without using bedrails?: None Help needed moving to and from a bed to a chair (including a wheelchair)?: A Little Help needed standing up from a chair using your arms (e.g., wheelchair or bedside chair)?: A Little Help needed to walk in hospital room?: A Little Help needed climbing 3-5 steps with a railing? : A Little 6 Click Score: 20    End of Session Equipment Utilized During Treatment: Gait belt Activity Tolerance: Treatment limited secondary to medical complications (Comment)(symptomatic orthostasis) Patient left: in bed;with call bell/phone within reach;with bed alarm set;with family/visitor present Nurse Communication: Mobility status PT Visit Diagnosis: Muscle weakness (generalized) (M62.81);Difficulty in walking, not elsewhere classified (R26.2)    Time: 4580-9983 PT Time Calculation (min) (ACUTE ONLY): 19 min   Charges:   PT Evaluation $PT Eval Moderate Complexity: 1 Mod PT Treatments $Therapeutic Activity: 8-22 mins        Ayiana Winslett H.  [place] Shark, PT, DPT, NCS 03/17/19, 2:12 PM [phone]

## 2019-03-17 NOTE — Progress Notes (Signed)
[facility] NAME: Erin Ibarra    MR#:  [id number]  DATE OF BIRTH:  09/06/1929  SUBJECTIVE:  CHIEF COMPLAINT:   Chief Complaint  Patient presents with  . Dizziness   Mental status back to baseline.  Discharge cancelled as patient got dizzy and orthostatic positive while working with PT.  REVIEW OF SYSTEMS:    Review of Systems  Constitutional: Negative for chills and fever.  HENT: Negative for sore throat.   Eyes: Negative for blurred vision, double vision and pain.  Respiratory: Negative for cough, hemoptysis, shortness of breath and wheezing.   Cardiovascular: Negative for chest pain, palpitations, orthopnea and leg swelling.  Gastrointestinal: Negative for abdominal pain, constipation, diarrhea, heartburn, nausea and vomiting.  Genitourinary: Negative for dysuria and hematuria.  Musculoskeletal: Negative for back pain and joint pain.  Skin: Negative for rash.  Neurological: Positive for dizziness. Negative for sensory change, speech change, focal weakness and headaches.  Endo/Heme/Allergies: Does not bruise/bleed easily.  Psychiatric/Behavioral: Negative for depression. The patient is not nervous/anxious.     DRUG ALLERGIES:  No Known Allergies  VITALS:  Blood pressure (!) 155/78, pulse 76, temperature 98.8 F (37.1 C), temperature source Oral, resp. rate 16, height 5\' 2"  (1.575 m), weight 42.3 kg, SpO2 94 %.  PHYSICAL EXAMINATION:   Physical Exam  GENERAL:  85 y.o.-year-old patient lying in the bed with no acute distress.  EYES: Pupils equal, round, reactive to light and accommodation. No scleral icterus. Extraocular muscles intact.  HEENT: Head atraumatic, normocephalic. Oropharynx and nasopharynx clear.  NECK:  Supple, no jugular venous distention. No thyroid enlargement, no tenderness.  LUNGS: Normal breath sounds bilaterally, no wheezing, rales, rhonchi. No use of accessory muscles of respiration.   CARDIOVASCULAR: S1, S2 normal. No murmurs, rubs, or gallops.  ABDOMEN: Soft, nontender, nondistended. Bowel sounds present. No organomegaly or mass.  EXTREMITIES: No cyanosis, clubbing or edema b/l.    NEUROLOGIC: Cranial nerves II through XII are intact. No focal Motor or sensory deficits b/l.   PSYCHIATRIC: The patient is alert and oriented x 3.  SKIN: No obvious rash, lesion, or ulcer.   LABORATORY PANEL:   CBC Recent Labs  Lab 03/17/19 0435  WBC 9.9  HGB 10.5*  HCT 31.9*  PLT 292   ------------------------------------------------------------------------------------------------------------------ Chemistries  Recent Labs  Lab 03/15/19 2209  03/17/19 0435  NA 133*   < > 137  K 4.2   < > 3.8  CL 97*   < > 106  CO2 22   < > 24  GLUCOSE 112*   < > 95  BUN 69*   < > 50*  CREATININE 1.99*   < > 1.45*  CALCIUM 9.9   < > 7.9*  AST 18  --   --   ALT 15  --   --   ALKPHOS 60  --   --   BILITOT 0.7  --   --    < > = values in this interval not displayed.   ------------------------------------------------------------------------------------------------------------------  Cardiac Enzymes No results for input(s): TROPONINI in the last 168 hours. ------------------------------------------------------------------------------------------------------------------  RADIOLOGY:  Ct Head Wo Contrast  Result Date: 03/15/2019 CLINICAL DATA:  85 year old female with confusion. EXAM: CT HEAD WITHOUT CONTRAST TECHNIQUE: Contiguous axial images were obtained from the base of the skull through the vertex without intravenous contrast. COMPARISON:  Brain MRI dated 11/19/2017 FINDINGS: Brain: There is mild age-related atrophy and chronic microvascular ischemic changes. There is no acute intracranial  hemorrhage. No mass effect or midline shift. No extra-axial fluid collection. Vascular: No hyperdense vessel or unexpected calcification. Skull: Normal. Negative for fracture or focal lesion.  Sinuses/Orbits: No acute finding. Other: None IMPRESSION: 1. No acute intracranial hemorrhage. 2. Age-related atrophy and chronic microvascular ischemic changes. Electronically Signed   By: Anner Crete M.D.   On: 03/15/2019 22:42   Mr Angiogram Neck W Or Wo Contrast  Result Date: 03/16/2019 CLINICAL DATA:  Arterial structures/occlusion in the head or neck. Dizziness. EXAM: MRA NECK WITHOUT AND WITH CONTRAST TECHNIQUE: Multiplanar and multiecho pulse sequences of the neck were obtained without and with intravenous contrast. Angiographic images of the neck were obtained using MRA technique without and with intravenous contrast. CONTRAST:  67mL GADAVIST GADOBUTROL 1 MMOL/ML IV SOLN COMPARISON:  None. FINDINGS: Time-of-flight shows antegrade flow in the carotid and vertebral circulation on both sides were covered. Postcontrast imaging shows normal diameter arch with 3 vessel branching. Cervical carotids are sufficiently patent throughout with mild tortuosity. Dominant right vertebral artery which is widely patent to the basilar. Mild stenosis is seen at the proximal left vertebral artery and V4 segment, the former may be accentuated by artifact. No proximal subclavian stenosis. Aplastic right A1 segment. Otherwise unremarkable intracranial vasculature. IMPRESSION: 1. Mild narrowing of the non dominant left vertebral artery. No evident vertebrobasilar insufficiency to explain dizziness. 2. Widely patent carotid circulation. Electronically Signed   By: Monte Fantasia M.D.   On: 03/16/2019 04:58   Mr Brain W And Wo Contrast  Result Date: 03/16/2019 CLINICAL DATA:  Ataxia with stroke suspected. EXAM: MRI HEAD WITHOUT AND WITH CONTRAST TECHNIQUE: Multiplanar, multiecho pulse sequences of the brain and surrounding structures were obtained without and with intravenous contrast. CONTRAST:  43mL GADAVIST GADOBUTROL 1 MMOL/ML IV SOLN COMPARISON:  Head CT from earlier today.  Brain MRI 11/19/2017 FINDINGS: Brain: No  acute infarction, hemorrhage, hydrocephalus, extra-axial collection or mass lesion. FLAIR hyperintensity to a moderate degree in the pons compatible with chronic small vessel ischemia. Milder small-vessel ischemic change in the supratentorial white matter, asymmetric to the left corona radiata. No significant change from prior. Cerebral volume loss that is central predominant with mild progression from 2014 by interventricular measurement, stable from most recent prior. No abnormal intracranial enhancement. Vascular: Major flow voids are preserved. Skull and upper cervical spine: Negative for marrow lesion Sinuses/Orbits: Right cataract resection. IMPRESSION: 1. No acute finding. 2. Chronic small vessel ischemia and cerebral volume loss without interval change. Electronically Signed   By: Monte Fantasia M.D.   On: 03/16/2019 04:53     ASSESSMENT AND PLAN:   1.  Acute Metabolic encephalopathy over mild cognitive impairement secondary to UTI.   On Iv rocephin. E coli in urine cx. Sensitivities pending Afebrile I had discharged patient with keflex but she got dizzy on working with PT and orthostats positive, D/C cancelled  Likely dehydrated. Bolus NS 1 liter and start 75 ml/hr.  2.  Hypertension.  We will continue her Coreg and hold off her Diovan and HCTZ given acute kidney injury and Orthostatic dizziness  3.  Hypothyroidism.  We will continue her Synthroid  4.  Glaucoma.  Her Alphagan will be resumed.  5.  DVT prophylaxis.  Subtenons Lovenox  All the records are reviewed and case discussed with Care Management/Social Worker Management plans discussed with the patient, family and they are in agreement.  CODE STATUS: Full code  DVT Prophylaxis: SCDs  TOTAL TIME TAKING CARE OF THIS PATIENT: 35 minutes.   POSSIBLE D/C  IN 1-2 DAYS, DEPENDING ON CLINICAL CONDITION.  Home health PT orders placed  Neita Carp M.D on 03/17/2019 at 6:42 PM  Between 7am to 6pm - Pager -  [phone]  After 6pm go to [email or web address] - password [email or web address] [facility]  Office  [phone]  CC: Primary care physician; Jerrol Banana., MD  Note: This dictation was prepared with Dragon dictation along with smaller phrase technology. Any transcriptional errors that result from this process are unintentional.

## 2019-03-18 LAB — URINE CULTURE: Culture: >=100000 — AB

## 2019-03-18 MED ORDER — DEXTROSE 5 % IV SOLN
0.5000 g | Freq: Two times a day (BID) | INTRAVENOUS | Status: DC
  Administered 2019-03-19: 10:00:00 0.5 g via INTRAVENOUS
  Filled 2019-03-18 (×4): qty 5

## 2019-03-18 MED ORDER — FUROSEMIDE 10 MG/ML IJ SOLN
20.0000 mg | Freq: Once | INTRAMUSCULAR | Status: AC
  Administered 2019-03-18: 20 mg via INTRAVENOUS
  Filled 2019-03-18: qty 4

## 2019-03-18 MED ORDER — MIDODRINE HCL 5 MG PO TABS
2.5000 mg | ORAL_TABLET | Freq: Three times a day (TID) | ORAL | Status: DC
  Administered 2019-03-18 – 2019-03-19 (×3): 2.5 mg via ORAL
  Filled 2019-03-18 (×4): qty 0.5

## 2019-03-18 MED ORDER — SODIUM CHLORIDE 0.9 % IV SOLN
INTRAVENOUS | Status: DC
  Administered 2019-03-18 – 2019-03-19 (×3): via INTRAVENOUS

## 2019-03-18 NOTE — Progress Notes (Signed)
Patient ID: [name], female   DOB: 03-Aug-1929, 85 y.o.   MRN: [id number]  [facility] PROGRESS NOTE  Erin Ibarra DDU:[id number] DOB: 26-Sep-1929 DOA: 03/16/2019 PCP: Jerrol Banana., MD  HPI/Subjective: Patient is feeling a little bit better.  Did feel a little lightheaded with walking with me this morning.  Objective: Vitals:   03/18/19 0445 03/18/19 1021  BP: (!) 161/77 (!) 149/69  Pulse: 86 89  Resp: 20   Temp: 98.6 F (37 C)   SpO2: 92%     Intake/Output Summary (Last 24 hours) at 03/18/2019 1323 Last data filed at 03/18/2019 0947 Gross per 24 hour  Intake 2070.97 ml  Output 900 ml  Net 1170.97 ml   Filed Weights   03/16/19 1302  Weight: 42.3 kg    ROS: Review of Systems  Constitutional: Negative for chills and fever.  Eyes: Negative for blurred vision.  Respiratory: Negative for cough and shortness of breath.   Cardiovascular: Negative for chest pain.  Gastrointestinal: Negative for abdominal pain, constipation, diarrhea, nausea and vomiting.  Genitourinary: Negative for dysuria.  Musculoskeletal: Negative for joint pain.  Neurological: Negative for dizziness and headaches.   Exam: Physical Exam  Constitutional: She is oriented to person, place, and time.  HENT:  Nose: No mucosal edema.  Mouth/Throat: No oropharyngeal exudate or posterior oropharyngeal edema.  Eyes: Pupils are equal, round, and reactive to light. Conjunctivae, EOM and lids are normal.  Neck: No JVD present. Carotid bruit is not present. No edema present. No thyroid mass and no thyromegaly present.  Cardiovascular: S1 normal and S2 normal. Exam reveals no gallop.  No murmur heard. Pulses:      Dorsalis pedis pulses are 2+ on the right side and 2+ on the left side.  Respiratory: No respiratory distress. She has no wheezes. She has no rhonchi. She has no rales.  GI: Soft. Bowel sounds are normal. There is no abdominal tenderness.  Musculoskeletal:     Right  ankle: She exhibits no swelling.     Left ankle: She exhibits no swelling.  Lymphadenopathy:    She has no cervical adenopathy.  Neurological: She is alert and oriented to person, place, and time. No cranial nerve deficit.  Skin: Skin is warm. No rash noted. Nails show no clubbing.  Psychiatric: She has a normal mood and affect.      Data Reviewed: Basic Metabolic Panel: Recent Labs  Lab 03/15/19 2209 03/16/19 0845 03/17/19 0435  NA 133* 134* 137  K 4.2 3.7 3.8  CL 97* 100 106  CO2 22 22 24   GLUCOSE 112* 93 95  BUN 69* 66* 50*  CREATININE 1.99* 1.77* 1.45*  CALCIUM 9.9 9.0 7.9*   Liver Function Tests: Recent Labs  Lab 03/15/19 2209  AST 18  ALT 15  ALKPHOS 60  BILITOT 0.7  PROT 6.6  ALBUMIN 3.5   CBC: Recent Labs  Lab 03/15/19 2209 03/17/19 0435  WBC 11.1* 9.9  NEUTROABS 7.6  --   HGB 12.0 10.5*  HCT 36.2 31.9*  MCV 96.8 95.8  PLT 312 292     Recent Results (from the past 240 hour(s))  Urine culture     Status: Abnormal   Collection Time: 03/16/19  1:29 AM   Specimen: Urine, Clean Catch  Result Value Ref Range Status   Specimen Description   Final    URINE, CLEAN CATCH Performed at [facility], [address], [place], [place] [address]    Special Requests  Final    NONE Performed at [facility], [address] [place], [place] [address]    Culture >=100,000 COLONIES/mL ESCHERICHIA COLI (A)  Final   Report Status 03/18/2019 FINAL  Final   Organism ID, Bacteria ESCHERICHIA COLI (A)  Final      Susceptibility   Escherichia coli - MIC*    AMPICILLIN <=2 SENSITIVE Sensitive     CEFAZOLIN <=4 SENSITIVE Sensitive     CEFTRIAXONE <=1 SENSITIVE Sensitive     CIPROFLOXACIN <=0.25 SENSITIVE Sensitive     GENTAMICIN <=1 SENSITIVE Sensitive     IMIPENEM <=0.25 SENSITIVE Sensitive     NITROFURANTOIN <=16 SENSITIVE Sensitive     TRIMETH/SULFA <=20 SENSITIVE Sensitive     AMPICILLIN/SULBACTAM <=2 SENSITIVE Sensitive     PIP/TAZO  <=4 SENSITIVE Sensitive     Extended ESBL NEGATIVE Sensitive     * >=100,000 COLONIES/mL ESCHERICHIA COLI  SARS Coronavirus 2 [facility] order, Performed in [facility] hospital lab) Nasopharyngeal Nasopharyngeal Swab     Status: None   Collection Time: 03/16/19  4:44 AM   Specimen: Nasopharyngeal Swab  Result Value Ref Range Status   SARS Coronavirus 2 NEGATIVE NEGATIVE Final    Comment: (NOTE) If result is NEGATIVE SARS-CoV-2 target nucleic acids are NOT DETECTED. The SARS-CoV-2 RNA is generally detectable in upper and lower  respiratory specimens during the acute phase of infection. The lowest  concentration of SARS-CoV-2 viral copies this assay can detect is 250  copies / mL. A negative result does not preclude SARS-CoV-2 infection  and should not be used as the sole basis for treatment or other  patient management decisions.  A negative result may occur with  improper specimen collection / handling, submission of specimen other  than nasopharyngeal swab, presence of viral mutation(s) within the  areas targeted by this assay, and inadequate number of viral copies  (<250 copies / mL). A negative result must be combined with clinical  observations, patient history, and epidemiological information. If result is POSITIVE SARS-CoV-2 target nucleic acids are DETECTED. The SARS-CoV-2 RNA is generally detectable in upper and lower  respiratory specimens dur ing the acute phase of infection.  Positive  results are indicative of active infection with SARS-CoV-2.  Clinical  correlation with patient history and other diagnostic information is  necessary to determine patient infection status.  Positive results do  not rule out bacterial infection or co-infection with other viruses. If result is PRESUMPTIVE POSTIVE SARS-CoV-2 nucleic acids MAY BE PRESENT.   A presumptive positive result was obtained on the submitted specimen  and confirmed on repeat testing.  While 2019 novel coronavirus   (SARS-CoV-2) nucleic acids may be present in the submitted sample  additional confirmatory testing may be necessary for epidemiological  and / or clinical management purposes  to differentiate between  SARS-CoV-2 and other Sarbecovirus currently known to infect humans.  If clinically indicated additional testing with an alternate test  methodology [phone]) is advised. The SARS-CoV-2 RNA is generally  detectable in upper and lower respiratory sp ecimens during the acute  phase of infection. The expected result is Negative. Fact Sheet for Patients:  StrictlyIdeas.no Fact Sheet for Healthcare Providers: BankingDealers.co.za This test is not yet approved or cleared by the [place] FDA and has been authorized for detection and/or diagnosis of SARS-CoV-2 by FDA under an Emergency Use Authorization (EUA).  This EUA will remain in effect (meaning this test can be used) for the duration of the COVID-19 declaration under Section 564(b)(1) of  the Act, 21 U.S.C. section 360bbb-3(b)(1), unless the authorization is terminated or revoked sooner. Performed at [facility], [address] [place], [place] [address]       Scheduled Meds: . aspirin EC  81 mg Oral Daily  . brimonidine  1 drop Right Eye BID  . calcium-vitamin D  1 tablet Oral Daily  . omega-3 acid ethyl esters  1 g Oral BID   And  . cholecalciferol  1,000 Units Oral BID  . dorzolamide-timolol  1 drop Both Eyes BID  . enoxaparin (LOVENOX) injection  30 mg Subcutaneous Q24H  . influenza vaccine adjuvanted  0.5 mL Intramuscular Tomorrow-1000  . memantine  5 mg Oral BID  . multivitamin with minerals  1 tablet Oral Daily  . Netarsudil-Latanoprost  1 drop Right Eye QPM   Continuous Infusions: . sodium chloride 75 mL/hr at 03/18/19 1141  . cefTRIAXone (ROCEPHIN)  IV 1 g (03/18/19 0551)  . sodium chloride      Assessment/Plan:  1. Orthostatic hypotension.  IV fluid  hydration.  Hold Coreg.  Patient already off Diovan and hydrochlorothiazide.  Cancel discharge today since still orthostatic.  TED hose. 2. Acute kidney injury and dehydration.  On IV fluids. 3. Acute metabolic encephalopathy has improved. 4. Acute cystitis on IV Rocephin.  Pansensitive E. coli growing in the urine culture. 5. Glaucoma on Alphagan  Code Status:     Code Status Orders  (From admission, onward)         Start     Ordered   03/16/19 0531  Full code  Continuous     03/16/19 0532        Code Status History    This patient has a current code status but no historical code status.   Advance Care Planning Activity     Family Communication: Spoke with daughter Disposition Plan: Home with home health once blood pressure stabilizes.  Antibiotics:  Rocephin  Time spent: 34 minutes  [place]

## 2019-03-19 LAB — CBC
HCT: 34 % — ABNORMAL LOW (ref 36.0–46.0)
Hemoglobin: 11.4 g/dL — ABNORMAL LOW (ref 12.0–15.0)
MCH: 32.2 pg (ref 26.0–34.0)
MCHC: 33.5 g/dL (ref 30.0–36.0)
MCV: 96 fL (ref 80.0–100.0)
Platelets: 278 10*3/uL (ref 150–400)
RBC: 3.54 MIL/uL — ABNORMAL LOW (ref 3.87–5.11)
RDW: 13.1 % (ref 11.5–15.5)
WBC: 10.9 10*3/uL — ABNORMAL HIGH (ref 4.0–10.5)
nRBC: 0 % (ref 0.0–0.2)

## 2019-03-19 LAB — BASIC METABOLIC PANEL
Anion gap: 8 (ref 5–15)
BUN: 34 mg/dL — ABNORMAL HIGH (ref 8–23)
CO2: 24 mmol/L (ref 22–32)
Calcium: 8.2 mg/dL — ABNORMAL LOW (ref 8.9–10.3)
Chloride: 103 mmol/L (ref 98–111)
Creatinine, Ser: 1.22 mg/dL — ABNORMAL HIGH (ref 0.44–1.00)
GFR calc Af Amer: 46 mL/min — ABNORMAL LOW (ref >60)
GFR calc non Af Amer: 40 mL/min — ABNORMAL LOW (ref >60)
Glucose, Bld: 90 mg/dL (ref 70–99)
Potassium: 3.9 mmol/L (ref 3.5–5.1)
Sodium: 135 mmol/L (ref 135–145)

## 2019-03-19 MED ORDER — MIDODRINE HCL 2.5 MG PO TABS: 2.5000 mg | ORAL_TABLET | Freq: Three times a day (TID) | ORAL | 0 refills | Status: AC

## 2019-03-19 NOTE — TOC Transition Note (Signed)
Transition of Care [facility]) - CM/SW Discharge Note   Patient Details  Name: Erin Ibarra MRN: [id number] Date of Birth: October 29, 1929  [facility]) CM/SW Contact:  Beverly Sessions, RN Phone Number: 03/19/2019, 11:26 AM   Clinical Narrative:    Patient to discharge today Daughter in law at bedside to transport  Outpatient PT and OT ordered signed by MD and faxed to [phone].  Confirmation received.   Copy of orders provided to patient. Notified to Call [facility] at [phone] to schedule therapy session   Final next level of care: Home w [facility](outpatient ot and pt)     Patient Goals and CMS Choice   CMS Medicare.gov Compare Post Acute Care list provided to:: Patient Represenative (must comment)(Daughter) Choice offered to / list presented to : Adult Children  Discharge Placement                  Name of family member notified: Daughter in law Patient and family notified of of transfer: 03/19/19  Discharge Plan and Services                          HH Arranged: PT, OT(outpatient)          Social Determinants of Health (SDOH) Interventions     Readmission Risk Interventions Readmission Risk Prevention Plan 03/17/2019  Transportation Screening Complete  PCP or Specialist Appt within 3-5 Days Complete  HRI or [place] Complete  Social Work Consult for [place] Planning/Counseling Not Complete  SW consult not completed comments NA  Palliative Care Screening Not Applicable  Medication Review (RN Transport planner) Referral to Pharmacy  Some recent data might be hidden

## 2019-03-19 NOTE — Discharge Summary (Signed)
Erin Ibarra at [place] NAME: Erin Ibarra    MR#:  [id number]  DATE OF BIRTH:  [birth year]  DATE OF ADMISSION:  03/16/2019 ADMITTING PHYSICIAN: Christel Mormon, MD  DATE OF DISCHARGE: 03/19/2019  PRIMARY CARE PHYSICIAN: Jerrol Banana., MD    ADMISSION DIAGNOSIS:  Ataxia [R27.0] Acute cystitis without hematuria [N30.00]  DISCHARGE DIAGNOSIS:  Active Problems:   UTI (urinary tract infection)   SECONDARY DIAGNOSIS:   Past Medical History:  Diagnosis Date  . Bladder cancer ([place]) 1991   Hx TUR and chemo tx's.   . Chronic kidney disease   . Glaucoma   . Heart murmur   . Hypertension   . Renal insufficiency    Hx Stage 3 kidney disease.  . Thyroid disease     HOSPITAL COURSE:   1.  Orthostatic hypotension.  The patient was given IV fluids during the hospital course.  We held Diovan and hydrochlorothiazide.  We then had to hold Coreg also.  I did start TED hose and started low-dose midodrine.  Blood pressure is higher to begin with and she does drop down a little bit.  I walked her around the room and she felt better than she did yesterday.  Patient does want to go home.  I would continue the midodrine at this point.  Would not restart any antihypertensive medications until taken off the midodrine.  I asked the patient to take a blood pressure log at home and give it to her PMD.  As her antihypertensive medications get out of her system and the midodrine is in the system blood pressure may go high.  Must check orthostatic vitals on every office follow-up visit. 2.  Acute kidney injury on chronic kidney disease stage III and dehydration.  Creatinine was 1.99 on admission and came down to 1.22 upon discharge home. 3.  Acute metabolic encephalopathy this has improved. 4.  Acute cystitis with pansensitive E. coli growing in urine culture.  The patient was on IV Rocephin switched to IV Ancef and then to p.o. Keflex upon discharge home. 5.   Glaucoma on Alph[place] 6.  Home health to be [facility] up [facility] CONDITIONS:   Satisfactory  CONSULTS OBTAINED:  None  DRUG ALLERGIES:  No Known Allergies  DISCHARGE MEDICATIONS:   Allergies as of 03/19/2019   No Known Allergies     Medication List    STOP taking these medications   carvedilol 12.5 MG tablet Commonly known as: COREG   hydrochlorothiazide 25 MG tablet Commonly known as: HYDRODIURIL   valsartan 80 MG tablet Commonly known as: DIOVAN     TAKE these medications   aspirin 81 MG tablet Take 81 mg by mouth daily.   brimonidine 0.2 % ophthalmic solution Commonly known as: ALPHAGAN Place 1 drop into the right eye 2 (two) times daily.   CALCIUM 600 + MINERALS PO Take 1 tablet by mouth daily.   cephALEXin 500 MG capsule Commonly known as: KEFLEX Take 1 capsule (500 mg total) by mouth 3 (three) times daily for 4 days.   dorzolamide-timolol 22.3-6.8 MG/ML ophthalmic solution Commonly known as: COSOPT Place 1 drop into both eyes 2 (two) times daily.   Fish Oil + D3 1000-1000 MG-UNIT Caps Take by mouth 2 (two) times daily.   levothyroxine 75 MCG tablet Commonly known as: SYNTHROID Take 1 tablet (75 mcg total) by mouth daily before breakfast.   memantine 5 MG tablet Commonly known as: NAMENDA  TAKE 1 TABLET BY MOUTH TWICE A DAY   midodrine 2.5 MG tablet Commonly known as: PROAMATINE Take 1 tablet (2.5 mg total) by mouth 3 (three) times daily with meals.   multivitamin with minerals tablet Take 1 tablet by mouth daily.   Rocklatan 0.02-0.005 % Soln Generic drug: Netarsudil-Latanoprost Place 1 drop into the right eye every evening.        DISCHARGE INSTRUCTIONS:   Follow-up PMD 5 days  If you experience worsening of your admission symptoms, develop shortness of breath, life threatening emergency, suicidal or homicidal thoughts you must seek medical attention immediately by calling 911 or calling your MD immediately  if symptoms  less severe.  You Must read complete instructions/literature along with all the possible adverse reactions/side effects for all the Medicines you take and that have been prescribed to you. Take any new Medicines after you have completely understood and accept all the possible adverse reactions/side effects.   Please note  You were cared for by a hospitalist during your hospital stay. If you have any questions about your discharge medications or the care you received while you were in the hospital after you are discharged, you can call the unit and asked to speak with the hospitalist on call if the hospitalist that took care of you is not available. Once you are discharged, your primary care physician will handle any further medical issues. Please note that NO REFILLS for any discharge medications will be authorized once you are discharged, as it is imperative that you return to your primary care physician (or establish a relationship with a primary care physician if you do not have one) for your aftercare needs so that they can reassess your need for medications and monitor your lab values.    Today   CHIEF COMPLAINT:   Chief Complaint  Patient presents with  . Dizziness    HISTORY OF PRESENT ILLNESS:  Erin Ibarra  is a 85 y.o. female presented with dizziness   VITAL SIGNS:  Blood pressure (!) 151/72, pulse 75, temperature 98 F (36.7 C), resp. rate 18, height 5\' 2"  (1.575 m), weight 42.3 kg, SpO2 94 %.   PHYSICAL EXAMINATION:  GENERAL:  85 y.o.-year-old patient lying in the bed with no acute distress.  EYES: Pupils equal, round, reactive to light and accommodation. No scleral icterus. Extraocular muscles intact.  HEENT: Head atraumatic, normocephalic. Oropharynx and nasopharynx clear.  NECK:  Supple, no jugular venous distention. No thyroid enlargement, no tenderness.  LUNGS: Normal breath sounds bilaterally, no wheezing, rales,rhonchi or crepitation. No use of accessory muscles of  respiration.  CARDIOVASCULAR: S1, S2 normal. No murmurs, rubs, or gallops.  ABDOMEN: Soft, non-tender, non-distended. Bowel sounds present. No organomegaly or mass.  EXTREMITIES: No pedal edema, cyanosis, or clubbing.  NEUROLOGIC: Cranial nerves II through XII are intact. Muscle strength 5/5 in all extremities. Sensation intact. Gait was steady with a walker.  Patient had no dizziness with walking. PSYCHIATRIC: The patient is alert and oriented x 3.  SKIN: No obvious rash, lesion, or ulcer.   DATA REVIEW:   CBC Recent Labs  Lab 03/19/19 0725  WBC 10.9*  HGB 11.4*  HCT 34.0*  PLT 278    Chemistries  Recent Labs  Lab 03/15/19 2209  03/19/19 0725  NA 133*   < > 135  K 4.2   < > 3.9  CL 97*   < > 103  CO2 22   < > 24  GLUCOSE 112*   < > 90  BUN 69*   < > 34*  CREATININE 1.99*   < > 1.22*  CALCIUM 9.9   < > 8.2*  AST 18  --   --   ALT 15  --   --   ALKPHOS 60  --   --   BILITOT 0.7  --   --    < > = values in this interval not displayed.    Microbiology Results  Results for orders placed or performed during the hospital encounter of 03/16/19  Urine culture     Status: Abnormal   Collection Time: 03/16/19  1:29 AM   Specimen: Urine, Clean Catch  Result Value Ref Range Status   Specimen Description   Final    URINE, CLEAN CATCH Performed at [facility], [address] [place], [place] [address]    Special Requests   Final    NONE Performed at [facility], [address] [place], [place] [address]    Culture >=100,000 COLONIES/mL ESCHERICHIA COLI (A)  Final   Report Status 03/18/2019 FINAL  Final   Organism ID, Bacteria ESCHERICHIA COLI (A)  Final      Susceptibility   Escherichia coli - MIC*    AMPICILLIN <=2 SENSITIVE Sensitive     CEFAZOLIN <=4 SENSITIVE Sensitive     CEFTRIAXONE <=1 SENSITIVE Sensitive     CIPROFLOXACIN <=0.25 SENSITIVE Sensitive     GENTAMICIN <=1 SENSITIVE Sensitive     IMIPENEM <=0.25 SENSITIVE Sensitive      NITROFURANTOIN <=16 SENSITIVE Sensitive     TRIMETH/SULFA <=20 SENSITIVE Sensitive     AMPICILLIN/SULBACTAM <=2 SENSITIVE Sensitive     PIP/TAZO <=4 SENSITIVE Sensitive     Extended ESBL NEGATIVE Sensitive     * >=100,000 COLONIES/mL ESCHERICHIA COLI  SARS Coronavirus [facility] order, Performed in [place] hospital lab) Nasopharyngeal Nasopharyngeal Swab     Status: None   Collection Time: 03/16/19  4:44 AM   Specimen: Nasopharyngeal Swab  Result Value Ref Range Status   SARS Coronavirus 2 NEGATIVE NEGATIVE Final    Comment: (NOTE) If result is NEGATIVE SARS-CoV-2 target nucleic acids are NOT DETECTED. The SARS-CoV-2 RNA is generally detectable in upper and lower  respiratory specimens during the acute phase of infection. The lowest  concentration of SARS-CoV-2 viral copies this assay can detect is 250  copies / mL. A negative result does not preclude SARS-CoV-2 infection  and should not be used as the sole basis for treatment or other  patient management decisions.  A negative result may occur with  improper specimen collection / handling, submission of specimen other  than nasopharyngeal swab, presence of viral mutation(s) within the  areas targeted by this assay, and inadequate number of viral copies  (<250 copies / mL). A negative result must be combined with clinical  observations, patient history, and epidemiological information. If result is POSITIVE SARS-CoV-2 target nucleic acids are DETECTED. The SARS-CoV-2 RNA is generally detectable in upper and lower  respiratory specimens dur ing the acute phase of infection.  Positive  results are indicative of active infection with SARS-CoV-2.  Clinical  correlation with patient history and other diagnostic information is  necessary to determine patient infection status.  Positive results do  not rule out bacterial infection or co-infection with other viruses. If result is PRESUMPTIVE POSTIVE SARS-CoV-2 nucleic acids MAY BE  PRESENT.   A presumptive positive result was obtained on the submitted specimen  and confirmed on repeat testing.  While 2019 novel coronavirus  (  SARS-CoV-2) nucleic acids may be present in the submitted sample  additional confirmatory testing may be necessary for epidemiological  and / or clinical management purposes  to differentiate between  SARS-CoV-2 and other Sarbecovirus currently known to infect humans.  If clinically indicated additional testing with an alternate test  methodology [phone]) is advised. The SARS-CoV-2 RNA is generally  detectable in upper and lower respiratory sp ecimens during the acute  phase of infection. The expected result is Negative. Fact Sheet for Patients:  StrictlyIdeas.no Fact Sheet for Healthcare Providers: BankingDealers.co.za This test is not yet approved or cleared by the [place] FDA and has been authorized for detection and/or diagnosis of SARS-CoV-2 by FDA under an Emergency Use Authorization (EUA).  This EUA will remain in effect (meaning this test can be used) for the duration of the COVID-19 declaration under Section 564(b)(1) of the Act, 21 U.S.C. section 360bbb-3(b)(1), unless the authorization is terminated or revoked sooner. Performed at [facility], [address] [place], [place] [address]      Management plans discussed with the patient, family and they are in agreement.  CODE STATUS:     Code Status Orders  (From admission, onward)         Start     Ordered   03/16/19 0531  Full code  Continuous     03/16/19 0532        Code Status History    This patient has a current code status but no historical code status.   Advance Care Planning Activity      TOTAL TIME TAKING CARE OF THIS PATIENT: 35 minutes.    Loletha Grayer M.D on 03/19/2019 at 11:40 AM  Between 7am to 6pm - Pager - [phone]  After 6pm go to [email or web address] - password [email or web address]  [facility]  [phone]  CC: Primary care physician; Jerrol Banana., MD

## 2019-03-19 NOTE — Progress Notes (Signed)
[name] to be D/C'd Independent Living at [facility] per MD order.  Discussed prescriptions and follow up appointments with the patient. Prescriptions given to patient, medication list explained in detail. Pt verbalized understanding.  Allergies as of 03/19/2019   No Known Allergies     Medication List    STOP taking these medications   carvedilol 12.5 MG tablet Commonly known as: COREG   hydrochlorothiazide 25 MG tablet Commonly known as: HYDRODIURIL   valsartan 80 MG tablet Commonly known as: DIOVAN     TAKE these medications   aspirin 81 MG tablet Take 81 mg by mouth daily.   brimonidine 0.2 % ophthalmic solution Commonly known as: ALPHAGAN Place 1 drop into the right eye 2 (two) times daily.   CALCIUM 600 + MINERALS PO Take 1 tablet by mouth daily.   cephALEXin 500 MG capsule Commonly known as: KEFLEX Take 1 capsule (500 mg total) by mouth 3 (three) times daily for 4 days.   dorzolamide-timolol 22.3-6.8 MG/ML ophthalmic solution Commonly known as: COSOPT Place 1 drop into both eyes 2 (two) times daily.   Fish Oil + D3 1000-1000 MG-UNIT Caps Take by mouth 2 (two) times daily.   levothyroxine 75 MCG tablet Commonly known as: SYNTHROID Take 1 tablet (75 mcg total) by mouth daily before breakfast.   memantine 5 MG tablet Commonly known as: NAMENDA TAKE 1 TABLET BY MOUTH TWICE A DAY   midodrine 2.5 MG tablet Commonly known as: PROAMATINE Take 1 tablet (2.5 mg total) by mouth 3 (three) times daily with meals.   multivitamin with minerals tablet Take 1 tablet by mouth daily.   Rocklatan 0.02-0.005 % Soln Generic drug: Netarsudil-Latanoprost Place 1 drop into the right eye every evening.       Vitals:   03/19/19 0417 03/19/19 1201  BP: (!) 151/72 (!) 181/87  Pulse: 75 81  Resp: 18 16  Temp: 98 F (36.7 C) 98.5 F (36.9 C)  SpO2: 94% 95%    Skin clean, dry and intact without evidence of skin break down, no evidence of skin tears noted.  IV catheter discontinued intact. Site without signs and symptoms of complications. Dressing and pressure applied. Pt denies pain at this time. No complaints noted.  An After Visit Summary was printed and given to the patient. Patient escorted via [place], and D/C home via private auto.  Chuck Hint RN [facility] [employer]

## 2019-03-20 ENCOUNTER — Telehealth: Payer: Self-pay

## 2019-03-20 NOTE — Telephone Encounter (Signed)
Transition Care Management Follow-up Telephone Call  Date of discharge and from where: [facility] on 03/19/19  How have you been since you were released from the hospital? Glad to be home but still a little dizzy and confused. Pt states she is fine when sitting still but when she stands up or moves she gets dizzy. Pt believes this is due to vision issues. Pt states she has to urinate frequently (every 2 hours) but thinks this is due to medications. Declines burning with urination, cloudiness or odor, fever, pain or n/v/d.    Any questions or concerns? Yes, concerned about still being light headed.   Items Reviewed:  Did the pt receive and understand the discharge instructions provided? Yes   Medications obtained and verified? Yes   Any new allergies since your discharge? No   Dietary orders reviewed? N/A  Do you have support at home? Yes, lives by herself but has multiple people coming in to check on her.  Other (ie: DME, Home Health, etc) N/A  Functional Questionnaire: (I = Independent and D = Dependent)  Bathing/Dressing- Has assistance currently.   Meal Prep- I  Eating- I  Maintaining continence- I  Transferring/Ambulation- Uses a walker for assistance.   Managing Meds- I   Follow up appointments reviewed:    PCP Hospital f/u appt confirmed? Yes  Scheduled to see Dr Rosanna Randy on 03/22/19 @ 11:00 AM.  [facility] f/u appt confirmed? N/A   Are transportation arrangements needed? No   If their condition worsens, is the pt aware to call  their PCP or go to the ED? Yes  Was the patient provided with contact information for the PCP's office or ED? Yes  Was the pt encouraged to call back with questions or concerns? Yes

## 2019-03-20 NOTE — Telephone Encounter (Signed)
HFU scheduled for 03/22/19.

## 2019-03-22 ENCOUNTER — Ambulatory Visit (INDEPENDENT_AMBULATORY_CARE_PROVIDER_SITE_OTHER): Payer: Medicare HMO | Admitting: Family Medicine

## 2019-03-22 ENCOUNTER — Other Ambulatory Visit: Payer: Self-pay

## 2019-03-22 VITALS — BP 122/78 | HR 71 | Temp 96.5°F | Wt 99.0 lb

## 2019-03-22 DIAGNOSIS — N159 Renal tubulo-interstitial disease, unspecified: Secondary | ICD-10-CM

## 2019-03-22 DIAGNOSIS — N189 Chronic kidney disease, unspecified: Secondary | ICD-10-CM

## 2019-03-22 DIAGNOSIS — I950 Idiopathic hypotension: Secondary | ICD-10-CM

## 2019-03-22 DIAGNOSIS — F0280 Dementia in other diseases classified elsewhere without behavioral disturbance: Secondary | ICD-10-CM

## 2019-03-22 DIAGNOSIS — G309 Alzheimer's disease, unspecified: Secondary | ICD-10-CM | POA: Diagnosis not present

## 2019-03-22 DIAGNOSIS — H539 Unspecified visual disturbance: Secondary | ICD-10-CM

## 2019-03-22 DIAGNOSIS — I10 Essential (primary) hypertension: Secondary | ICD-10-CM | POA: Diagnosis not present

## 2019-03-22 MED ORDER — LISINOPRIL 40 MG PO TABS: 40.0000 mg | ORAL_TABLET | Freq: Every day | ORAL | 3 refills | Status: DC

## 2019-03-22 NOTE — Progress Notes (Signed)
Patient: Erin Ibarra Female    DOB: 1929/08/09   85 y.o.   MRN: [id number] Visit Date: 03/22/2019  Today's Provider: Wilhemena Durie, MD   Chief Complaint  Patient presents with  . Hospitalization Follow-up    Cystitis   Subjective:     HPI Transition of care visit. Kidney infection and now hypotensive.Slowly feeling better. No Known Allergies   Current Outpatient Medications:  .  aspirin 81 MG tablet, Take 81 mg by mouth daily., Disp: , Rfl:  .  brimonidine (ALPHAGAN) 0.2 % ophthalmic solution, Place 1 drop into the right eye 2 (two) times daily. , Disp: , Rfl:  .  Calcium Carbonate-Vit D-Min (CALCIUM 600 + MINERALS PO), Take 1 tablet by mouth daily., Disp: , Rfl:  .  dorzolamide-timolol (COSOPT) 22.3-6.8 MG/ML ophthalmic solution, Place 1 drop into both eyes 2 (two) times daily. , Disp: , Rfl: 5 .  Fish Oil-Cholecalciferol (FISH OIL + D3) 1000-1000 MG-UNIT CAPS, Take by mouth 2 (two) times daily., Disp: , Rfl:  .  levothyroxine (SYNTHROID, LEVOTHROID) 75 MCG tablet, Take 1 tablet (75 mcg total) by mouth daily before breakfast., Disp: 90 tablet, Rfl: 3 .  memantine (NAMENDA) 5 MG tablet, TAKE 1 TABLET BY MOUTH TWICE A DAY (Patient taking differently: Take 5 mg by mouth 2 (two) times daily. ), Disp: 180 tablet, Rfl: 1 .  Multiple Vitamins-Minerals (MULTIVITAMIN WITH MINERALS) tablet, Take 1 tablet by mouth daily., Disp: , Rfl:  .  ROCKLATAN 0.02-0.005 % SOLN, Place 1 drop into the right eye every evening., Disp: , Rfl:  .  midodrine (PROAMATINE) 2.5 MG tablet, Take 1 tablet (2.5 mg total) by mouth 3 (three) times daily with meals. (Patient not taking: Reported on 03/20/2019), Disp: 90 tablet, Rfl: 0  Review of Systems  Constitutional: Positive for fatigue. Negative for fever.  HENT: Negative.  Negative for ear pain, sinus pressure and sore throat.   Eyes: Negative.   Respiratory: Negative.  Negative for shortness of breath.   Cardiovascular: Negative.  Negative  for chest pain.  Gastrointestinal: Negative.   Endocrine: Negative.   Genitourinary: Positive for frequency and urgency.  Musculoskeletal: Negative.   Skin: Negative.   Allergic/Immunologic: Negative.   Neurological: Negative.   Hematological: Negative.   Psychiatric/Behavioral: Negative.     Social History   Tobacco Use  . Smoking status: Former Smoker    Packs/day: 1.00    Years: 15.00    Pack years: 15.00    Types: Cigarettes  . Smokeless tobacco: Never Used  . Tobacco comment: > 15 years ago  Substance Use Topics  . Alcohol use: Yes    Alcohol/week: 7.0 standard drinks    Types: 7 Glasses of wine per week    Comment: 1 glass of wine qhs      Objective:   BP 122/78 (BP Location: Right Arm, Patient Position: Sitting, Cuff Size: Normal)   Pulse 71   Temp (!) 96.5 F (35.8 C) (Skin)   Wt 99 lb (44.9 kg)   SpO2 97%   BMI 18.11 kg/m  Vitals:   03/22/19 1135  BP: 122/78  Pulse: 71  Temp: (!) 96.5 F (35.8 C)  TempSrc: Skin  SpO2: 97%  Weight: 99 lb (44.9 kg)  Body mass index is 18.11 kg/m.   Physical Exam Constitutional:      General: She is not in acute distress.    Appearance: She is well-developed. She is not toxic-appearing.  Comments: Cachectic WF NAD  HENT:     Head: Normocephalic and atraumatic.     Right Ear: External ear normal.     Left Ear: External ear normal.     Nose: Nose normal.  Eyes:     Conjunctiva/sclera: Conjunctivae normal.     Pupils: Pupils are equal, round, and reactive to light.  Neck:     Musculoskeletal: Normal range of motion and neck supple.  Cardiovascular:     Rate and Rhythm: Normal rate and regular rhythm.     Heart sounds: Normal heart sounds.  Pulmonary:     Effort: Pulmonary effort is normal.     Breath sounds: Normal breath sounds.  Abdominal:     General: Bowel sounds are normal.     Palpations: Abdomen is soft.  Musculoskeletal: Normal range of motion.  Skin:    General: Skin is warm and dry.   Neurological:     Mental Status: She is alert and oriented to person, place, and time.  Psychiatric:        Behavior: Behavior normal.        Thought Content: Thought content normal.        Judgment: Judgment normal.      No results found for any visits on 03/22/19.     Assessment & Plan    1. Kidney infection Transition of care visit. Recheck urine. - CBC with Differential/Platelet - Renal function panel - Urine Culture  2. Alzheimer's dementia without behavioral disturbance, unspecified timing of dementia onset ([place]) MMSE next visit.  3. Visual disturbances   4. Essential hypertension Now off of all meds.  5. Chronic kidney disease, unspecified CKD stage  - CBC with Differential/Platelet - Renal function panel  6. Idiopathic hypotension Decrease midodrine from TID to BID. Hope to stop this in future.RTC 2 months.     Kree Armato Cranford Mon, MD  [facility]

## 2019-03-22 NOTE — Patient Instructions (Signed)
Decrease Midodrine from three tomes a day to twice a day.

## 2019-03-23 DIAGNOSIS — N159 Renal tubulo-interstitial disease, unspecified: Secondary | ICD-10-CM | POA: Diagnosis not present

## 2019-03-23 LAB — RENAL FUNCTION PANEL
Albumin: 3.8 g/dL (ref 3.6–4.6)
BUN/Creatinine Ratio: 19 (ref 12–28)
BUN: 25 mg/dL (ref 8–27)
CO2: 22 mmol/L (ref 20–29)
Calcium: 9.6 mg/dL (ref 8.7–10.3)
Chloride: 95 mmol/L — ABNORMAL LOW (ref 96–106)
Creatinine, Ser: 1.29 mg/dL — ABNORMAL HIGH (ref 0.57–1.00)
GFR calc Af Amer: 43 mL/min/{1.73_m2} — ABNORMAL LOW (ref >59)
GFR calc non Af Amer: 37 mL/min/{1.73_m2} — ABNORMAL LOW (ref >59)
Glucose: 108 mg/dL — ABNORMAL HIGH (ref 65–99)
Phosphorus: 4 mg/dL (ref 3.0–4.3)
Potassium: 4.5 mmol/L (ref 3.5–5.2)
Sodium: 132 mmol/L — ABNORMAL LOW (ref 134–144)

## 2019-03-23 LAB — CBC WITH DIFFERENTIAL/PLATELET
Basophils Absolute: 0.1 10*3/uL (ref 0.0–0.2)
Basos: 1 %
EOS (ABSOLUTE): 0.4 10*3/uL (ref 0.0–0.4)
Eos: 3 %
Hematocrit: 37.2 % (ref 34.0–46.6)
Hemoglobin: 12.3 g/dL (ref 11.1–15.9)
Immature Grans (Abs): 0 10*3/uL (ref 0.0–0.1)
Immature Granulocytes: 0 %
Lymphocytes Absolute: 2.3 10*3/uL (ref 0.7–3.1)
Lymphs: 20 %
MCH: 31.9 pg (ref 26.6–33.0)
MCHC: 33.1 g/dL (ref 31.5–35.7)
MCV: 96 fL (ref 79–97)
Monocytes Absolute: 1 10*3/uL — ABNORMAL HIGH (ref 0.1–0.9)
Monocytes: 9 %
Neutrophils Absolute: 7.4 10*3/uL — ABNORMAL HIGH (ref 1.4–7.0)
Neutrophils: 67 %
Platelets: 317 10*3/uL (ref 150–450)
RBC: 3.86 x10E6/uL (ref 3.77–5.28)
RDW: 11.8 % (ref 11.7–15.4)
WBC: 11.2 10*3/uL — ABNORMAL HIGH (ref 3.4–10.8)

## 2019-03-25 LAB — URINE CULTURE: Organism ID, Bacteria: NO GROWTH

## 2019-03-27 ENCOUNTER — Telehealth: Payer: Self-pay | Admitting: Family Medicine

## 2019-03-27 NOTE — Telephone Encounter (Signed)
Tell them to stop the midodrine.

## 2019-03-27 NOTE — Telephone Encounter (Signed)
Pt's son called saying his moms blood pressure is running high and he wants to know what they should do.  The nurses are checking it and don't seem concerned but he is 188/85  Please advise [phone]  [facility]

## 2019-03-27 NOTE — Telephone Encounter (Signed)
Please review. Thanks!  

## 2019-03-27 NOTE — Telephone Encounter (Signed)
Advised 

## 2019-04-04 DIAGNOSIS — H348121 Central retinal vein occlusion, left eye, with retinal neovascularization: Secondary | ICD-10-CM | POA: Diagnosis not present

## 2019-04-05 DIAGNOSIS — H348122 Central retinal vein occlusion, left eye, stable: Secondary | ICD-10-CM | POA: Diagnosis not present

## 2019-04-05 DIAGNOSIS — I519 Heart disease, unspecified: Secondary | ICD-10-CM | POA: Diagnosis not present

## 2019-04-05 DIAGNOSIS — E7800 Pure hypercholesterolemia, unspecified: Secondary | ICD-10-CM | POA: Diagnosis not present

## 2019-04-05 DIAGNOSIS — R0600 Dyspnea, unspecified: Secondary | ICD-10-CM | POA: Diagnosis not present

## 2019-04-05 DIAGNOSIS — I10 Essential (primary) hypertension: Secondary | ICD-10-CM | POA: Diagnosis not present

## 2019-04-05 DIAGNOSIS — R55 Syncope and collapse: Secondary | ICD-10-CM | POA: Diagnosis not present

## 2019-04-05 DIAGNOSIS — Q231 Congenital insufficiency of aortic valve: Secondary | ICD-10-CM | POA: Diagnosis not present

## 2019-04-06 ENCOUNTER — Other Ambulatory Visit: Payer: Self-pay

## 2019-04-06 ENCOUNTER — Encounter: Payer: Self-pay | Admitting: Family Medicine

## 2019-04-06 ENCOUNTER — Ambulatory Visit (INDEPENDENT_AMBULATORY_CARE_PROVIDER_SITE_OTHER): Payer: Medicare HMO | Admitting: Family Medicine

## 2019-04-06 VITALS — BP 154/85 | HR 85 | Temp 96.6°F | Resp 16 | Wt 93.2 lb

## 2019-04-06 DIAGNOSIS — E7800 Pure hypercholesterolemia, unspecified: Secondary | ICD-10-CM | POA: Diagnosis not present

## 2019-04-06 DIAGNOSIS — M810 Age-related osteoporosis without current pathological fracture: Secondary | ICD-10-CM | POA: Diagnosis not present

## 2019-04-06 DIAGNOSIS — I130 Hypertensive heart and chronic kidney disease with heart failure and stage 1 through stage 4 chronic kidney disease, or unspecified chronic kidney disease: Secondary | ICD-10-CM

## 2019-04-06 DIAGNOSIS — G3184 Mild cognitive impairment, so stated: Secondary | ICD-10-CM

## 2019-04-06 DIAGNOSIS — N183 Chronic kidney disease, stage 3 unspecified: Secondary | ICD-10-CM

## 2019-04-06 DIAGNOSIS — C679 Malignant neoplasm of bladder, unspecified: Secondary | ICD-10-CM | POA: Diagnosis not present

## 2019-04-06 DIAGNOSIS — H348192 Central retinal vein occlusion, unspecified eye, stable: Secondary | ICD-10-CM | POA: Diagnosis not present

## 2019-04-06 NOTE — Patient Instructions (Addendum)
1. Stop taking Midodrine  2. Track blood pressure readings weekly

## 2019-04-06 NOTE — Progress Notes (Signed)
Patient: Erin Ibarra Female    DOB: 12-14-1929   85 y.o.   MRN: [id number] Visit Date: 04/06/2019  Today's Provider: Wilhemena Durie, MD   Chief Complaint  Patient presents with  . Weight Loss   Subjective:     HPI  Patient is here to discuss lab results, weight loss, and diet changes.  This is a visit after hospitalization for hypotension, acute kidney injury on top of chronic kidney disease, dehydration and possible urinary tract infection.  She is slowly getting her strength back.  She was put on Midodrine for hypotension and her antihypertensives were stopped.  She is 9 pounds lighter than she was a week ago and her daughter is here with this asking about her dietary issues with her CKD and her weight loss. No Known Allergies   Current Outpatient Medications:  .  aspirin 81 MG tablet, Take 81 mg by mouth daily., Disp: , Rfl:  .  brimonidine (ALPHAGAN) 0.2 % ophthalmic solution, Place 1 drop into the right eye 2 (two) times daily. , Disp: , Rfl:  .  Calcium Carbonate-Vit D-Min (CALCIUM 600 + MINERALS PO), Take 1 tablet by mouth daily., Disp: , Rfl:  .  dorzolamide-timolol (COSOPT) 22.3-6.8 MG/ML ophthalmic solution, Place 1 drop into both eyes 2 (two) times daily. , Disp: , Rfl: 5 .  Fish Oil-Cholecalciferol (FISH OIL + D3) 1000-1000 MG-UNIT CAPS, Take by mouth 2 (two) times daily., Disp: , Rfl:  .  levothyroxine (SYNTHROID, LEVOTHROID) 75 MCG tablet, Take 1 tablet (75 mcg total) by mouth daily before breakfast., Disp: 90 tablet, Rfl: 3 .  memantine (NAMENDA) 5 MG tablet, TAKE 1 TABLET BY MOUTH TWICE A DAY (Patient taking differently: Take 5 mg by mouth 2 (two) times daily. ), Disp: 180 tablet, Rfl: 1 .  midodrine (PROAMATINE) 2.5 MG tablet, Take 1 tablet (2.5 mg total) by mouth 3 (three) times daily with meals. (Patient not taking: Reported on 03/20/2019), Disp: 90 tablet, Rfl: 0 .  Multiple Vitamins-Minerals (MULTIVITAMIN WITH MINERALS) tablet, Take 1 tablet by  mouth daily., Disp: , Rfl:  .  ROCKLATAN 0.02-0.005 % SOLN, Place 1 drop into the right eye every evening., Disp: , Rfl:   Review of Systems  Constitutional: Positive for fatigue.  HENT: Negative.   Eyes: Positive for visual disturbance.  Respiratory: Negative.   Endocrine: Negative.   Genitourinary: Negative.   Neurological: Negative.   Psychiatric/Behavioral: Negative.     Social History   Tobacco Use  . Smoking status: Former Smoker    Packs/day: 1.00    Years: 15.00    Pack years: 15.00    Types: Cigarettes  . Smokeless tobacco: Never Used  . Tobacco comment: > 15 years ago  Substance Use Topics  . Alcohol use: Yes    Alcohol/week: 7.0 standard drinks    Types: 7 Glasses of wine per week    Comment: 1 glass of wine qhs      Objective:   BP (!) 154/85 (BP Location: Right Arm, Patient Position: Sitting)   Pulse 85   Temp (!) 96.6 F (35.9 C) (Temporal)   Resp 16   Wt 93 lb 3.2 oz (42.3 kg)   SpO2 99%   BMI 17.05 kg/m  Vitals:   04/06/19 1603 04/06/19 1614  BP: (!) 179/92 (!) 154/85  Pulse: 85   Resp: 16   Temp: (!) 96.6 F (35.9 C)   TempSrc: Temporal   SpO2: 99%   Weight:  93 lb 3.2 oz (42.3 kg)   Body mass index is 17.05 kg/m. Recheck blood pressure is 154/78  Physical Exam Vitals signs reviewed.  Constitutional:      Appearance: She is well-developed.     Comments: Cachectic WF NAD  HENT:     Head: Normocephalic and atraumatic.     Right Ear: External ear normal.     Left Ear: External ear normal.     Nose: Nose normal.  Eyes:     Conjunctiva/sclera: Conjunctivae normal.     Pupils: Pupils are equal, round, and reactive to light.  Neck:     Musculoskeletal: Normal range of motion and neck supple.  Cardiovascular:     Rate and Rhythm: Normal rate and regular rhythm.     Heart sounds: Normal heart sounds.  Pulmonary:     Effort: Pulmonary effort is normal.     Breath sounds: Normal breath sounds.  Abdominal:     General: Bowel sounds are  normal.     Palpations: Abdomen is soft.  Musculoskeletal: Normal range of motion.  Skin:    General: Skin is warm and dry.  Neurological:     Mental Status: She is alert and oriented to person, place, and time.  Psychiatric:        Behavior: Behavior normal.        Thought Content: Thought content normal.        Judgment: Judgment normal.      No results found for any visits on 04/06/19.     Assessment & Plan    1. Benign hypertensive heart and CKD, stage 3 (GFR 30-59), w CHF ([place]) After long discussion with patient and daughter will allow her to eat food of her choice in small amounts.  No fluid she will slowly start gaining weight.  She will still watch sodium but not to the extent that she has to this time. Check blood pressure weekly and bring in from home.  May need to add back 1 of her medications. 2. MCI (mild cognitive impairment) with memory loss More than 50% 25 minute visit spent in counseling or coordination of care   3. Age-related osteoporosis without current pathological fracture   4. Hypercholesteremia   5. Malignant neoplasm of urinary bladder, unspecified site ([place])      Wilhemena Durie, MD  [facility]

## 2019-05-04 DIAGNOSIS — M272 Inflammatory conditions of jaws: Secondary | ICD-10-CM | POA: Diagnosis not present

## 2019-05-16 ENCOUNTER — Other Ambulatory Visit: Payer: Self-pay | Admitting: *Deleted

## 2019-05-16 DIAGNOSIS — G3184 Mild cognitive impairment, so stated: Secondary | ICD-10-CM

## 2019-05-17 NOTE — Progress Notes (Deleted)
   {  Method of visit:23308}  Patient: Erin Ibarra Female    DOB: 02/11/1930   85 y.o.   MRN: [id number] Visit Date: 05/17/2019  Today's Provider: Wilhemena Durie, MD   No chief complaint on file.  Subjective:     HPI   Benign hypertensive heart and CKD, stage 3 (GFR 30-59), w CHF ([place]) From 04/06/2019-After long discussion with patient and daughter will allow her to eat food of her choice in small amounts.  No fluid she will slowly start gaining weight.  She will still watch sodium but not to the extent that she has to this time. Check blood pressure weekly and bring in from home.  May need to add back 1 of her medications.  No Known Allergies   Current Outpatient Medications:  .  aspirin 81 MG tablet, Take 81 mg by mouth daily., Disp: , Rfl:  .  brimonidine (ALPHAGAN) 0.2 % ophthalmic solution, Place 1 drop into the right eye 2 (two) times daily. , Disp: , Rfl:  .  Calcium Carbonate-Vit D-Min (CALCIUM 600 + MINERALS PO), Take 1 tablet by mouth daily., Disp: , Rfl:  .  dorzolamide-timolol (COSOPT) 22.3-6.8 MG/ML ophthalmic solution, Place 1 drop into both eyes 2 (two) times daily. , Disp: , Rfl: 5 .  Fish Oil-Cholecalciferol (FISH OIL + D3) 1000-1000 MG-UNIT CAPS, Take by mouth 2 (two) times daily., Disp: , Rfl:  .  levothyroxine (SYNTHROID, LEVOTHROID) 75 MCG tablet, Take 1 tablet (75 mcg total) by mouth daily before breakfast., Disp: 90 tablet, Rfl: 3 .  memantine (NAMENDA) 5 MG tablet, TAKE 1 TABLET BY MOUTH TWICE A DAY (Patient taking differently: Take 5 mg by mouth 2 (two) times daily. ), Disp: 180 tablet, Rfl: 1 .  midodrine (PROAMATINE) 2.5 MG tablet, Take 1 tablet (2.5 mg total) by mouth 3 (three) times daily with meals. (Patient not taking: Reported on 03/20/2019), Disp: 90 tablet, Rfl: 0 .  Multiple Vitamins-Minerals (MULTIVITAMIN WITH MINERALS) tablet, Take 1 tablet by mouth daily., Disp: , Rfl:  .  ROCKLATAN 0.02-0.005 % SOLN, Place 1 drop into the right eye  every evening., Disp: , Rfl:   Review of Systems  Constitutional: Negative for appetite change, chills, fatigue and fever.  Respiratory: Negative for chest tightness and shortness of breath.   Cardiovascular: Negative for chest pain and palpitations.  Gastrointestinal: Negative for abdominal pain, nausea and vomiting.  Neurological: Negative for dizziness and weakness.    Social History   Tobacco Use  . Smoking status: Former Smoker    Packs/day: 1.00    Years: 15.00    Pack years: 15.00    Types: Cigarettes  . Smokeless tobacco: Never Used  . Tobacco comment: > 15 years ago  Substance Use Topics  . Alcohol use: Yes    Alcohol/week: 7.0 standard drinks    Types: 7 Glasses of wine per week    Comment: 1 glass of wine qhs      Objective:   There were no vitals taken for this visit. There were no vitals filed for this visit.There is no height or weight on file to calculate BMI.   Physical Exam   No results found for any visits on 05/18/19.     Assessment & Plan        Wilhemena Durie, MD  [facility]

## 2019-05-18 ENCOUNTER — Ambulatory Visit: Payer: Self-pay | Admitting: Family Medicine

## 2019-05-18 MED ORDER — MEMANTINE HCL 5 MG PO TABS: 5.0000 mg | ORAL_TABLET | Freq: Two times a day (BID) | ORAL | 1 refills | Status: AC

## 2019-05-24 NOTE — Progress Notes (Signed)
Patient: Erin Ibarra Female    DOB: 1930-07-01   85 y.o.   MRN: [id number] Visit Date: 05/25/2019  Today's Provider: Wilhemena Durie, MD   Chief Complaint  Patient presents with   Follow-up   Weight Loss   Hypertension   Subjective:     Hypertension This is a chronic problem. The problem is unchanged. Pertinent negatives include no chest pain, palpitations or shortness of breath. Past treatments include lifestyle changes. Compliance problems include diet.   She is also not taking her BP at home as instructed.  She gets her meals from the [facility]. She states that she is still loosing weight.   Benign hypertensive heart and CKD, stage 3 (GFR 30-59), w CHF ([place]) From 04/06/2019-After long discussion with patient and daughter will allow her to eat food of her choice in small amounts.  No fluid she will slowly start gaining weight.  She will still watch sodium but not to the extent that she has to this time. Check blood pressure weekly and bring in from home.  May need to add back 1 of her medications.   MCI (mild cognitive impairment) with memory loss From 04/06/2019-More than 50% 25 minute visit spent in counseling or coordination of care    No Known Allergies   Current Outpatient Medications:    latanoprost (XALATAN) 0.005 % ophthalmic solution, INSTILL 1 DROP INTO RIGHT EYE AT BEDTIME, Disp: , Rfl:    aspirin 81 MG tablet, Take 81 mg by mouth daily., Disp: , Rfl:    brimonidine (ALPHAGAN) 0.2 % ophthalmic solution, Place 1 drop into the right eye 2 (two) times daily. , Disp: , Rfl:    Calcium Carbonate-Vit D-Min (CALCIUM 600 + MINERALS PO), Take 1 tablet by mouth daily., Disp: , Rfl:    carvedilol (COREG) 12.5 MG tablet, , Disp: , Rfl:    dorzolamide-timolol (COSOPT) 22.3-6.8 MG/ML ophthalmic solution, Place 1 drop into both eyes 2 (two) times daily. , Disp: , Rfl: 5   Fish Oil-Cholecalciferol (FISH OIL + D3) 1000-1000 MG-UNIT CAPS, Take  by mouth 2 (two) times daily., Disp: , Rfl:    hydrochlorothiazide (HYDRODIURIL) 25 MG tablet, , Disp: , Rfl:    levothyroxine (SYNTHROID, LEVOTHROID) 75 MCG tablet, Take 1 tablet (75 mcg total) by mouth daily before breakfast., Disp: 90 tablet, Rfl: 3   lisinopril (ZESTRIL) 40 MG tablet, , Disp: , Rfl:    memantine (NAMENDA) 5 MG tablet, Take 1 tablet (5 mg total) by mouth 2 (two) times daily., Disp: 180 tablet, Rfl: 1   midodrine (PROAMATINE) 2.5 MG tablet, Take 1 tablet (2.5 mg total) by mouth 3 (three) times daily with meals. (Patient not taking: Reported on 03/20/2019), Disp: 90 tablet, Rfl: 0   Multiple Vitamins-Minerals (MULTIVITAMIN WITH MINERALS) tablet, Take 1 tablet by mouth daily., Disp: , Rfl:    Omega-3 1000 MG CAPS, Take by mouth., Disp: , Rfl:    ROCKLATAN 0.02-0.005 % SOLN, Place 1 drop into the right eye every evening., Disp: , Rfl:    valsartan (DIOVAN) 40 MG tablet, , Disp: , Rfl:   Review of Systems  Constitutional: Negative for appetite change, chills, fatigue and fever.  HENT: Negative.   Eyes: Positive for visual disturbance.  Respiratory: Negative for chest tightness and shortness of breath.   Cardiovascular: Negative for chest pain and palpitations.  Gastrointestinal: Negative for abdominal pain, nausea and vomiting.  Endocrine: Negative.   Allergic/Immunologic: Negative.   Neurological: Negative  for dizziness and weakness.  Psychiatric/Behavioral: Negative.     Social History   Tobacco Use   Smoking status: Former Smoker    Packs/day: 1.00    Years: 15.00    Pack years: 15.00    Types: Cigarettes   Smokeless tobacco: Never Used   Tobacco comment: > 15 years ago  Substance Use Topics   Alcohol use: Yes    Alcohol/week: 7.0 standard drinks    Types: 7 Glasses of wine per week    Comment: 1 glass of wine qhs      Objective:   BP (!) 167/82    Pulse 65    Temp (!) 96.6 F (35.9 C) (Temporal)    Resp 18    Ht 5\' 2"  (1.575 m)    Wt 96 lb  (43.5 kg)    BMI 17.56 kg/m  Vitals:   05/25/19 1355  BP: (!) 167/82  Pulse: 65  Resp: 18  Temp: (!) 96.6 F (35.9 C)  TempSrc: Temporal  Weight: 96 lb (43.5 kg)  Height: 5\' 2"  (1.575 m)  Body mass index is 17.56 kg/m.   Physical Exam Vitals signs reviewed.  Constitutional:      Appearance: She is well-developed.     Comments: Cachectic WF NAD  HENT:     Head: Normocephalic and atraumatic.     Right Ear: External ear normal.     Left Ear: External ear normal.     Nose: Nose normal.  Eyes:     Conjunctiva/sclera: Conjunctivae normal.     Pupils: Pupils are equal, round, and reactive to light.  Neck:     Musculoskeletal: Normal range of motion and neck supple.  Cardiovascular:     Rate and Rhythm: Normal rate and regular rhythm.     Heart sounds: Normal heart sounds.  Pulmonary:     Effort: Pulmonary effort is normal.     Breath sounds: Normal breath sounds.  Abdominal:     General: Bowel sounds are normal.     Palpations: Abdomen is soft.  Musculoskeletal: Normal range of motion.  Skin:    General: Skin is warm and dry.  Neurological:     Mental Status: She is alert and oriented to person, place, and time.  Psychiatric:        Behavior: Behavior normal.        Thought Content: Thought content normal.        Judgment: Judgment normal.      No results found for any visits on 05/25/19.     Assessment & Plan    1. Need for influenza vaccination  - Flu Vaccine QUAD High Dose(Fluad)  2. Benign hypertensive heart and CKD, stage 3 (GFR 30-59), w CHF ([place]) Continue taking the Lisinopril and HCTZ and follow up in 2 months  3. MCI (mild cognitive impairment) with memory loss   4. Age-related osteoporosis without current pathological fracture 5.Visual disturbance--retinal vein occlusion    Wilhemena Durie, MD  [facility]

## 2019-05-25 ENCOUNTER — Ambulatory Visit (INDEPENDENT_AMBULATORY_CARE_PROVIDER_SITE_OTHER): Payer: Medicare HMO | Admitting: Family Medicine

## 2019-05-25 ENCOUNTER — Other Ambulatory Visit: Payer: Self-pay

## 2019-05-25 ENCOUNTER — Encounter: Payer: Self-pay | Admitting: Family Medicine

## 2019-05-25 VITALS — BP 167/82 | HR 65 | Temp 96.6°F | Resp 18 | Ht 62.0 in | Wt 96.0 lb

## 2019-05-25 DIAGNOSIS — H539 Unspecified visual disturbance: Secondary | ICD-10-CM

## 2019-05-25 DIAGNOSIS — G3184 Mild cognitive impairment, so stated: Secondary | ICD-10-CM

## 2019-05-25 DIAGNOSIS — Z23 Encounter for immunization: Secondary | ICD-10-CM | POA: Diagnosis not present

## 2019-05-25 DIAGNOSIS — N183 Chronic kidney disease, stage 3 unspecified: Secondary | ICD-10-CM

## 2019-05-25 DIAGNOSIS — M810 Age-related osteoporosis without current pathological fracture: Secondary | ICD-10-CM | POA: Diagnosis not present

## 2019-05-25 DIAGNOSIS — I130 Hypertensive heart and chronic kidney disease with heart failure and stage 1 through stage 4 chronic kidney disease, or unspecified chronic kidney disease: Secondary | ICD-10-CM

## 2019-05-25 MED ORDER — HYDROCHLOROTHIAZIDE 25 MG PO TABS: 25.0000 mg | ORAL_TABLET | Freq: Every day | ORAL | 3 refills | Status: AC

## 2019-05-25 MED ORDER — VALSARTAN 40 MG PO TABS: 40.0000 mg | ORAL_TABLET | Freq: Every day | ORAL | 3 refills | Status: DC

## 2019-05-25 NOTE — Patient Instructions (Signed)
Restart the Valsartan Once a day. Continue to keep your diet changes.

## 2019-06-22 DIAGNOSIS — H401113 Primary open-angle glaucoma, right eye, severe stage: Secondary | ICD-10-CM | POA: Diagnosis not present

## 2019-07-11 DIAGNOSIS — H348121 Central retinal vein occlusion, left eye, with retinal neovascularization: Secondary | ICD-10-CM | POA: Diagnosis not present

## 2019-07-19 DIAGNOSIS — K1379 Other lesions of oral mucosa: Secondary | ICD-10-CM | POA: Diagnosis not present

## 2019-07-20 ENCOUNTER — Other Ambulatory Visit: Payer: Self-pay | Admitting: Family Medicine

## 2019-07-20 ENCOUNTER — Telehealth: Payer: Self-pay

## 2019-07-20 DIAGNOSIS — Q383 Other congenital malformations of tongue: Secondary | ICD-10-CM

## 2019-07-20 MED ORDER — LEVOTHYROXINE SODIUM 75 MCG PO TABS: 75.0000 ug | ORAL_TABLET | Freq: Every day | ORAL | 0 refills | Status: DC

## 2019-07-20 NOTE — Telephone Encounter (Signed)
Copied from [place] [id number]. Topic: General - Other >> Jul 20, 2019  1:59 PM Celene Kras wrote: Reason for CRM: Pt called and is requesting to have a referral for Dr. Patrina Levering at dental surgery. Pt did not have the information of the name of the office. Pt states she cannot see this provider without a referral due to her change in insurance. Pt has an appt tomorrow. Please advise.

## 2019-07-20 NOTE — Telephone Encounter (Signed)
OK to place referral, but we'll need to know why she is being seen to associate the referral.

## 2019-07-20 NOTE — Telephone Encounter (Signed)
Copied from [place] [id number]. Topic: General - Other >> Jul 20, 2019  8:31 AM Leward Quan A wrote: Reason for CRM: Pateint daughter Ivin Booty called to say that she have an appointment for a biopsy on her tongue in the morning of 07/20/2018. She is asking Dr Rosanna Randy if patient need to take Amoxicillin like she do for dental procedures. She states that if she does then she is all out and need an Rx sent to the pharmacy today please. Also need a call back either way at Ph# [phone]

## 2019-07-20 NOTE — Telephone Encounter (Signed)
No, to the best of my knowledge and review of her chart, she doesn't. If the surgeon wants her to have prophylactic antibiotics before the procedure he/she will give it to her.

## 2019-07-20 NOTE — Telephone Encounter (Signed)
Patient's daughter advised.  

## 2019-07-20 NOTE — Telephone Encounter (Signed)
Spoke with Ivin Booty who states that she has already been advised that patient will not need PA. Please see previous telephone encounter. KW

## 2019-07-20 NOTE — Telephone Encounter (Signed)
Copied from [place] [phone]. Topic: Appointment Scheduling - Prior Auth Required for Appointment >> Jul 20, 2019 11:42 AM Alanda Slim E wrote: this appointment requires a prior authorization   Pt has a biopsy scheduled tomorrow morning at 8am at [facility] dentistry and the appt needs an prior authorization / phone# [phone] Erin Ibarra)/ please call Daughter back to advise when this is done  Route to department's [facility] pool.

## 2019-07-20 NOTE — Telephone Encounter (Signed)
Patient daughter Ivin Booty called stating PCP needs to write the PA and fax over  [place] Attention Othella Boyer [phone]  [id number] call back [phone]

## 2019-07-20 NOTE — Telephone Encounter (Signed)
Pt needs a refill on levothyroxine 75 mcg. [address]. Pt has an appt on 08-02-2019

## 2019-07-21 DIAGNOSIS — C021 Malignant neoplasm of border of tongue: Secondary | ICD-10-CM | POA: Diagnosis not present

## 2019-07-25 ENCOUNTER — Ambulatory Visit: Payer: Self-pay | Admitting: Family Medicine

## 2019-07-27 DIAGNOSIS — C021 Malignant neoplasm of border of tongue: Secondary | ICD-10-CM | POA: Diagnosis not present

## 2019-08-02 ENCOUNTER — Ambulatory Visit: Payer: Self-pay | Admitting: Family Medicine

## 2019-08-02 DIAGNOSIS — C021 Malignant neoplasm of border of tongue: Secondary | ICD-10-CM | POA: Diagnosis not present

## 2019-08-03 ENCOUNTER — Other Ambulatory Visit: Payer: Self-pay | Admitting: Dentistry

## 2019-08-03 DIAGNOSIS — C029 Malignant neoplasm of tongue, unspecified: Secondary | ICD-10-CM

## 2019-08-07 NOTE — Progress Notes (Signed)
Subjective:   Erin Ibarra is a 86 y.o. female who presents for Medicare Annual (Subsequent) preventive examination.    This visit is being conducted through telemedicine due to the COVID-19 pandemic. This patient has given me verbal consent via doximity to conduct this visit, patient states they are participating from their home address. Some vital signs may be absent or patient reported.    Patient identification: identified by name, DOB, and current address  Review of Systems:  N/A  Cardiac Risk Factors include: advanced age (>70men, >84 women);hypertension     Objective:     Vitals: There were no vitals taken for this visit.  There is no height or weight on file to calculate BMI. Unable to obtain vitals due to visit being conducted via telephonically.   Advanced Directives 08/08/2019 03/15/2019 03/10/2018 10/24/2017 07/13/2017 03/04/2017 09/24/2016  Does Patient Have a Medical Advance Directive? Yes No Yes Yes Yes Yes Yes  Type of Paramedic of [place];Living will - Living will;Healthcare Power of [place];Living will [place];Living will [place];Living will  Does patient want to make changes to medical advance directive? - - - - No - Patient declined - -  Copy of [place] in Chart? No - copy requested - No - copy requested No - copy requested No - copy requested No - copy requested No - copy requested  Would patient like information on creating a medical advance directive? - No - Patient declined - - - - -    Tobacco Social History   Tobacco Use  Smoking Status Former Smoker  . Packs/day: 1.00  . Years: 15.00  . Pack years: 15.00  . Types: Cigarettes  Smokeless Tobacco Never Used  Tobacco Comment   > 15 years ago     Counseling given: Not Answered Comment: > 15 years ago   Clinical Intake:  Pre-visit preparation  completed: Yes  Pain : 0-10 Pain Score: 5  Pain Type: Chronic pain(Due to tongue cancer.) Pain Location: Mouth(tongue) Pain Descriptors / Indicators: Aching Pain Frequency: Intermittent     Nutritional Risks: None Diabetes: No  How often do you need to have someone help you when you read instructions, pamphlets, or other written materials from your doctor or pharmacy?: 1 - Never  Interpreter Needed?: No  Information entered by :: [facility], LPN  Past Medical History:  Diagnosis Date  . Bladder cancer ([place]) 1991   Hx TUR and chemo tx's.   . Chronic kidney disease   . Glaucoma   . Heart murmur   . Hypertension   . Renal insufficiency    Hx Stage 3 kidney disease.  . Thyroid disease   . Tongue cancer [facility])    Past Surgical History:  Procedure Laterality Date  . broke knee cap    . CATARACT EXTRACTION  2015  . COLONOSCOPY  2006  . TRANSURETHRAL RESECTION OF BLADDER  1991   Family History  Problem Relation Age of Onset  . Prostate cancer Father   . Cancer Father        prostate  . Heart disease Father        MI  . Pancreatic cancer Brother   . Lung cancer Brother   . Cancer Brother        brain, lung  . Heart disease Brother        MI   Social History   Socioeconomic History  .  Marital status: Widowed    Spouse name: Not on file  . Number of children: 4  . Years of education: Not on file  . Highest education level: Bachelor's degree (e.g., BA, AB, BS)  Occupational History  . Occupation: retired  Tobacco Use  . Smoking status: Former Smoker    Packs/day: 1.00    Years: 15.00    Pack years: 15.00    Types: Cigarettes  . Smokeless tobacco: Never Used  . Tobacco comment: > 15 years ago  Substance and Sexual Activity  . Alcohol use: Not Currently    Alcohol/week: 0.0 standard drinks  . Drug use: No  . Sexual activity: Not on file  Other Topics Concern  . Not on file  Social History Narrative  . Not on file   Social Determinants of Health    Financial Resource Strain: Low Risk   . Difficulty of Paying Living Expenses: Not hard at all  Food Insecurity: No Food Insecurity  . Worried About Charity fundraiser in the Last Year: Never true  . Ran Out of Food in the Last Year: Never true  Transportation Needs: No Transportation Needs  . Lack of Transportation (Medical): No  . Lack of Transportation (Non-Medical): No  Physical Activity: Inactive  . Days of Exercise per Week: 0 days  . Minutes of Exercise per Session: 0 min  Stress: Stress Concern Present  . Feeling of Stress : Rather much  Social Connections: Somewhat Isolated  . Frequency of Communication with Friends and Family: More than three times a week  . Frequency of Social Gatherings with Friends and Family: Never  . Attends Religious Services: Never  . Active Member of Clubs or Organizations: Yes  . Attends Archivist Meetings: Never  . Marital Status: Widowed    Outpatient Encounter Medications as of 08/08/2019  Medication Sig  . amoxicillin (AMOXIL) 500 MG capsule amoxicillin 500 mg capsule  TAKE 4 CAPSULES BY MOUTH WITH FOOD 1 HOUR PRIOR TO APPOINTMENT  . aspirin 81 MG tablet Take 81 mg by mouth daily.  . brimonidine (ALPHAGAN) 0.2 % ophthalmic solution Place 1 drop into the right eye 2 (two) times daily.   . Calcium Carbonate-Vit D-Min (CALCIUM 600 + MINERALS PO) Take 1 tablet by mouth daily.  . carvedilol (COREG) 12.5 MG tablet Take 12.5 mg by mouth daily.   . dorzolamide-timolol (COSOPT) 22.3-6.8 MG/ML ophthalmic solution Place 1 drop into both eyes 2 (two) times daily.   . hydrochlorothiazide (HYDRODIURIL) 25 MG tablet Take 1 tablet (25 mg total) by mouth daily.  Marland Kitchen latanoprost (XALATAN) 0.005 % ophthalmic solution INSTILL 1 DROP INTO RIGHT EYE AT BEDTIME  . levothyroxine (SYNTHROID) 75 MCG tablet Take 1 tablet (75 mcg total) by mouth daily before breakfast.  . lisinopril (ZESTRIL) 40 MG tablet Take 40 mg by mouth daily.   . memantine (NAMENDA) 5  MG tablet Take 1 tablet (5 mg total) by mouth 2 (two) times daily. (Patient taking differently: Take 5 mg by mouth daily. )  . Omega-3 1000 MG CAPS Take by mouth.  Marland Kitchen ROCKLATAN 0.02-0.005 % SOLN Place 1 drop into the right eye every evening.  . Fish Oil-Cholecalciferol (FISH OIL + D3) 1000-1000 MG-UNIT CAPS Take by mouth daily.   . midodrine (PROAMATINE) 2.5 MG tablet Take 1 tablet (2.5 mg total) by mouth 3 (three) times daily with meals.  . Multiple Vitamins-Minerals (MULTIVITAMIN WITH MINERALS) tablet Take 1 tablet by mouth daily.   No facility-administered encounter medications on  file as of 08/08/2019.    Activities of Daily Living In your present state of health, do you have any difficulty performing the following activities: 08/08/2019 05/25/2019  Hearing? Tempie Donning  Comment Wears bilateral hearing aids. wears hearing aids  Vision? Y Y  Comment Has tunnel vision in right eye. -  Difficulty concentrating or making decisions? Tempie Donning  Comment Currently on [place]. -  Walking or climbing stairs? Y Y  Comment Due to balance issues. -  Dressing or bathing? - N  Doing errands, shopping? Tempie Donning  Comment Not driving currently. -  Preparing Food and eating ? N -  Using the Toilet? N -  In the past six months, have you accidently leaked urine? N -  Do you have problems with loss of bowel control? N -  Managing your Medications? N -  Managing your Finances? Y -  Comment Daughter assists. -  Housekeeping or managing your Housekeeping? Y -  Comment Aids come in to clean. -  Some recent data might be hidden    Patient Care Team: Jerrol Banana., MD as PCP - General (Family Medicine) Murlean Iba, MD as Consulting Physician (Internal Medicine) Leandrew Koyanagi, MD as Referring Physician (Ophthalmology) Isaias Cowman, MD as Consulting Physician (Cardiology) Kipp Laurence, MD as Referring Physician (Audiology)    Assessment:   This is a routine wellness examination for  Zynasia.  Exercise Activities and Dietary recommendations Current Exercise Habits: The patient does not participate in regular exercise at present, Exercise limited by: orthopedic condition(s)  Goals    . Increase water intake     Recommend increasing water intake to 4-6 glasses a day.        Fall Risk: Fall Risk  08/08/2019 03/10/2018 03/04/2017 01/29/2016  Falls in the past year? 0 Yes Yes Yes  Number falls in past yr: 0 2 or more 2 or more 1  Injury with Fall? 0 Yes No No  Comment - broke the left knee cap bruising -  Risk for fall due to : - Impaired balance/gait;Impaired vision - -  Follow up - Falls prevention discussed Falls prevention discussed -    FALL RISK PREVENTION PERTAINING TO THE HOME:  Any stairs in or around the home? No  If so, are there any without handrails? N/A  Home free of loose throw rugs in walkways, pet beds, electrical cords, etc? Yes  Adequate lighting in your home to reduce risk of falls? Yes   ASSISTIVE DEVICES UTILIZED TO PREVENT FALLS:  Life alert? Yes  Use of a cane, walker or w/c? Yes  Grab bars in the bathroom? Yes  Shower chair or bench in shower? No  Elevated toilet seat or a handicapped toilet? No    TIMED UP AND GO:  Was the test performed? No .    Depression Screen PHQ 2/9 Scores 08/08/2019 05/25/2019 03/10/2018 03/04/2017  PHQ - 2 Score 3 2 0 1  PHQ- 9 Score 6 12 4 3      Cognitive Function: Declined today.  MMSE - Mini Mental State Exam 05/25/2019 09/02/2017  Orientation to time 4 5  Orientation to Place 5 5  Registration 3 3  Attention/ Calculation 5 5  Recall 0 3  Language- name 2 objects 2 2  Language- repeat 1 1  Language- follow 3 step command 3 3  Language- read & follow direction 1 1  Write a sentence 1 1  Copy design 1 1  Total score 26 30  6CIT Screen 03/04/2017  What Year? 0 points  What month? 0 points  What time? 0 points  Count back from 20 0 points  Months in reverse 0 points  Repeat phrase 8 points   Total Score 8    Immunization History  Administered Date(s) Administered  . Fluad Quad(high Dose 65+) 05/25/2019  . H1N1 07/02/2008  . Influenza Split 07/19/2009  . Influenza, High Dose Seasonal PF 04/06/2014, 04/06/2015, 03/04/2017, 03/10/2018  . Influenza,inj,Quad PF,6+ Mos 03/29/2013  . Pneumococcal Conjugate-13 04/06/2014  . Pneumococcal Polysaccharide-23 04/30/1997  . Td 03/06/2004, 08/04/2013  . Tdap 08/04/2013  . Zoster 09/20/2008    Qualifies for Shingles Vaccine? Yes  Zostavax completed 09/20/08. Due for Shingrix. Pt has been advised to call insurance company to determine out of pocket expense. Advised may also receive vaccine at local pharmacy or Health Dept. Verbalized acceptance and understanding.  Tdap: Up to date  Flu Vaccine: Up to date  Pneumococcal Vaccine: Completed series  Screening Tests Health Maintenance  Topic Date Due  . DEXA SCAN  11/03/2021  . TETANUS/TDAP  08/05/2023  . INFLUENZA VACCINE  Completed  . PNA vac Low Risk Adult  Completed    Cancer Screenings:  Colorectal Screening: No longer required.   Mammogram: No longer required.   Bone Density: Completed 11/03/16. Results reflect OSTEOPENIA. Repeat every 5 years.   Lung Cancer Screening: (Low Dose CT Chest recommended if Age 41-80 years, 30 pack-year currently smoking OR have quit w/in 15years.) does not qualify.   Additional Screening:  Vision Screening: Recommended annual ophthalmology exams for early detection of glaucoma and other disorders of the eye.  Dental Screening: Recommended annual dental exams for proper oral hygiene  Community Resource Referral:  CRR required this visit?  No       Plan:  I have personally reviewed and addressed the Medicare Annual Wellness questionnaire and have noted the following in the patient's chart:  A. Medical and social history B. Use of alcohol, tobacco or illicit drugs  C. Current medications and supplements D. Functional ability and  status E.  Nutritional status F.  Physical activity G. Advance directives H. List of other physicians I.  Hospitalizations, surgeries, and ER visits in previous 12 months J.  [place] such as hearing and vision if needed, cognitive and depression L. Referrals and appointments   In addition, I have reviewed and discussed with patient certain preventive protocols, quality metrics, and best practice recommendations. A written personalized care plan for preventive services as well as general preventive health recommendations were provided to patient. Nurse Health Advisor  Signed,    Karion Cudd [place], [place]  07/09/1028 Nurse Health Advisor   Nurse Notes: Please follow up on medications at today's in office apt. Pt was not with her medications during the call so there were some she was unsure about. Requested she bring an updated list OR her pill bottles to apt to verify.

## 2019-08-08 ENCOUNTER — Ambulatory Visit (INDEPENDENT_AMBULATORY_CARE_PROVIDER_SITE_OTHER): Payer: Medicare HMO

## 2019-08-08 ENCOUNTER — Ambulatory Visit (INDEPENDENT_AMBULATORY_CARE_PROVIDER_SITE_OTHER): Payer: Medicare HMO | Admitting: Family Medicine

## 2019-08-08 ENCOUNTER — Encounter: Payer: Self-pay | Admitting: Family Medicine

## 2019-08-08 ENCOUNTER — Other Ambulatory Visit: Payer: Self-pay

## 2019-08-08 VITALS — BP 120/72 | HR 85 | Temp 96.9°F | Resp 18 | Ht 62.0 in | Wt 98.0 lb

## 2019-08-08 DIAGNOSIS — M87180 Osteonecrosis due to drugs, jaw: Secondary | ICD-10-CM | POA: Diagnosis not present

## 2019-08-08 DIAGNOSIS — C029 Malignant neoplasm of tongue, unspecified: Secondary | ICD-10-CM

## 2019-08-08 DIAGNOSIS — G3184 Mild cognitive impairment, so stated: Secondary | ICD-10-CM | POA: Diagnosis not present

## 2019-08-08 DIAGNOSIS — F419 Anxiety disorder, unspecified: Secondary | ICD-10-CM | POA: Diagnosis not present

## 2019-08-08 DIAGNOSIS — T458X5A Adverse effect of other primarily systemic and hematological agents, initial encounter: Secondary | ICD-10-CM

## 2019-08-08 DIAGNOSIS — F329 Major depressive disorder, single episode, unspecified: Secondary | ICD-10-CM

## 2019-08-08 DIAGNOSIS — F32A Depression, unspecified: Secondary | ICD-10-CM

## 2019-08-08 DIAGNOSIS — Z0000 Encounter for general adult medical examination without abnormal findings: Secondary | ICD-10-CM | POA: Diagnosis not present

## 2019-08-08 DIAGNOSIS — I5022 Chronic systolic (congestive) heart failure: Secondary | ICD-10-CM

## 2019-08-08 DIAGNOSIS — R64 Cachexia: Secondary | ICD-10-CM | POA: Diagnosis not present

## 2019-08-08 MED ORDER — SERTRALINE HCL 25 MG PO TABS: 25.0000 mg | ORAL_TABLET | Freq: Every day | ORAL | 1 refills | Status: DC

## 2019-08-08 NOTE — Progress Notes (Signed)
Patient: Erin Ibarra Female    DOB: 10-06-1929   86 y.o.   MRN: [id number] Visit Date: 08/08/2019  Today's Provider: Wilhemena Durie, MD   Chief Complaint  Patient presents with  . Follow-up   Subjective:     HPI  Son comes in with pt for f/u . She is struggling with isolation of Covid but also with tongue cancer.  She has MRI for the tongue cancer next week.  She has a son [name] who is here today and her daughter that lives in [place]. 2 month follow up for weight loss.  Is gained 2 pounds since her last visit. She denies depression but has a little bit of anhedonia.  She scores 14 on the PHQ-9  No Known Allergies   Current Outpatient Medications:  .  amoxicillin (AMOXIL) 500 MG capsule, amoxicillin 500 mg capsule  TAKE 4 CAPSULES BY MOUTH WITH FOOD 1 HOUR PRIOR TO APPOINTMENT, Disp: , Rfl:  .  aspirin 81 MG tablet, Take 81 mg by mouth daily., Disp: , Rfl:  .  brimonidine (ALPHAGAN) 0.2 % ophthalmic solution, Place 1 drop into the right eye 2 (two) times daily. , Disp: , Rfl:  .  Calcium Carbonate-Vit D-Min (CALCIUM 600 + MINERALS PO), Take 1 tablet by mouth daily., Disp: , Rfl:  .  carvedilol (COREG) 12.5 MG tablet, Take 12.5 mg by mouth daily. , Disp: , Rfl:  .  dorzolamide-timolol (COSOPT) 22.3-6.8 MG/ML ophthalmic solution, Place 1 drop into both eyes 2 (two) times daily. , Disp: , Rfl: 5 .  Fish Oil-Cholecalciferol (FISH OIL + D3) 1000-1000 MG-UNIT CAPS, Take by mouth daily. , Disp: , Rfl:  .  hydrochlorothiazide (HYDRODIURIL) 25 MG tablet, Take 1 tablet (25 mg total) by mouth daily., Disp: 90 tablet, Rfl: 3 .  latanoprost (XALATAN) 0.005 % ophthalmic solution, INSTILL 1 DROP INTO RIGHT EYE AT BEDTIME, Disp: , Rfl:  .  levothyroxine (SYNTHROID) 75 MCG tablet, Take 1 tablet (75 mcg total) by mouth daily before breakfast., Disp: 90 tablet, Rfl: 0 .  lisinopril (ZESTRIL) 40 MG tablet, Take 40 mg by mouth daily. , Disp: , Rfl:  .  memantine (NAMENDA) 5 MG tablet,  Take 1 tablet (5 mg total) by mouth 2 (two) times daily. (Patient taking differently: Take 5 mg by mouth daily. ), Disp: 180 tablet, Rfl: 1 .  midodrine (PROAMATINE) 2.5 MG tablet, Take 1 tablet (2.5 mg total) by mouth 3 (three) times daily with meals., Disp: 90 tablet, Rfl: 0 .  Multiple Vitamins-Minerals (MULTIVITAMIN WITH MINERALS) tablet, Take 1 tablet by mouth daily., Disp: , Rfl:  .  Omega-3 1000 MG CAPS, Take by mouth., Disp: , Rfl:  .  ROCKLATAN 0.02-0.005 % SOLN, Place 1 drop into the right eye every evening., Disp: , Rfl:   Review of Systems  Constitutional: Negative for appetite change, chills, fatigue and fever.  HENT: Positive for mouth sores and trouble swallowing.   Eyes: Positive for visual disturbance.  Respiratory: Negative for chest tightness and shortness of breath.   Cardiovascular: Negative for chest pain and palpitations.  Gastrointestinal: Negative for abdominal pain, nausea and vomiting.  Endocrine: Negative.   Allergic/Immunologic: Negative.   Neurological: Negative for dizziness and weakness.  Hematological: Negative.   Psychiatric/Behavioral: Positive for dysphoric mood.    Social History   Tobacco Use  . Smoking status: Former Smoker    Packs/day: 1.00    Years: 15.00    Pack years: 15.00  Types: Cigarettes  . Smokeless tobacco: Never Used  . Tobacco comment: > 15 years ago  Substance Use Topics  . Alcohol use: Not Currently    Alcohol/week: 0.0 standard drinks      Objective:   BP 120/72 (BP Location: Left Arm, Patient Position: Sitting, Cuff Size: Small)   Pulse 85   Temp (!) 96.9 F (36.1 C) (Other (Comment))   Resp 18   Ht 5\' 2"  (1.575 m)   Wt 98 lb (44.5 kg)   SpO2 99%   BMI 17.92 kg/m  Vitals:   08/08/19 1429  BP: 120/72  Pulse: 85  Resp: 18  Temp: (!) 96.9 F (36.1 C)  TempSrc: Other (Comment)  SpO2: 99%  Weight: 98 lb (44.5 kg)  Height: 5\' 2"  (1.575 m)  Body mass index is 17.92 kg/m.   Physical Exam Vitals reviewed.   Constitutional:      Appearance: She is well-developed.     Comments: Cachectic WF NAD  HENT:     Head: Normocephalic and atraumatic.     Right Ear: External ear normal.     Left Ear: External ear normal.     Nose: Nose normal.     Mouth/Throat:     Mouth: Mucous membranes are moist.  Eyes:     General: No scleral icterus.    Conjunctiva/sclera: Conjunctivae normal.     Pupils: Pupils are equal, round, and reactive to light.  Cardiovascular:     Rate and Rhythm: Normal rate and regular rhythm.     Heart sounds: Normal heart sounds.  Pulmonary:     Effort: Pulmonary effort is normal.     Breath sounds: Normal breath sounds.  Abdominal:     General: Bowel sounds are normal.     Palpations: Abdomen is soft.  Musculoskeletal:        General: Normal range of motion.     Cervical back: Normal range of motion and neck supple.  Skin:    General: Skin is warm and dry.  Neurological:     Mental Status: She is alert and oriented to person, place, and time.  Psychiatric:        Behavior: Behavior normal.        Thought Content: Thought content normal.        Judgment: Judgment normal.      No results found for any visits on 08/08/19.     Assessment & Plan    1. Anxiety More than 50% 25 minute visit spent in counseling or coordination of care  - sertraline (ZOLOFT) 25 MG tablet; Take 1 tablet (25 mg total) by mouth daily.  Dispense: 30 tablet; Refill: 1  2. Depression, unspecified depression type RTC 1 month--MCI and PHQ9 then. - sertraline (ZOLOFT) 25 MG tablet; Take 1 tablet (25 mg total) by mouth daily.  Dispense: 30 tablet; Refill: 1  3. Chronic systolic heart failure ([place])   4. MCI (mild cognitive impairment) with memory loss   5. 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  Follow up in one month.     I,Benay Pomeroy,acting as a scribe for Wilhemena Durie, MD.,have documented all relevant documentation on the  behalf of Wilhemena Durie, MD,as directed by  Wilhemena Durie, MD while in the presence of Wilhemena Durie, MD.      Wilhemena Durie, MD  [facility]

## 2019-08-08 NOTE — Patient Instructions (Signed)
[name] , Thank you for taking time to come for your Medicare Wellness Visit. I appreciate your ongoing commitment to your health goals. Please review the following plan we discussed and let me know if I can assist you in the future.   Screening recommendations/referrals: Colonoscopy: No longer required.  Mammogram: No longer required.  Bone Density: Up to date, due 11/2021 Recommended yearly ophthalmology/optometry visit for glaucoma screening and checkup Recommended yearly dental visit for hygiene and checkup  Vaccinations: Influenza vaccine: Pt declines today.  Pneumococcal vaccine: Completed series Tdap vaccine: Up to date, due 07/2023 Shingles vaccine: Pt declines today.     Advanced directives: Please bring a copy of your POA (Power of Attorney) and/or Living Will to your next appointment.   Conditions/risks identified: Recommend increasing water intake to 6-8 8 oz glasses a day.   Next appointment: 2:20 PM today with Dr Rosanna Randy. Declined scheduling an AWV for 2022 at this time.    Preventive Care 16 Years and Older, Female Preventive care refers to lifestyle choices and visits with your health care provider that can promote health and wellness. What does preventive care include?  A yearly physical exam. This is also called an annual well check.  Dental exams once or twice a year.  Routine eye exams. Ask your health care provider how often you should have your eyes checked.  Personal lifestyle choices, including:  Daily care of your teeth and gums.  Regular physical activity.  Eating a healthy diet.  Avoiding tobacco and drug use.  Limiting alcohol use.  Practicing safe sex.  Taking low-dose aspirin every day.  Taking vitamin and mineral supplements as recommended by your health care provider. What happens during an annual well check? The services and screenings done by your health care provider during your annual well check will depend on your age, overall  health, lifestyle risk factors, and family history of disease. Counseling  Your health care provider may ask you questions about your:  Alcohol use.  Tobacco use.  Drug use.  Emotional well-being.  Home and relationship well-being.  Sexual activity.  Eating habits.  History of falls.  Memory and ability to understand (cognition).  Work and work Statistician.  Reproductive health. Screening  You may have the following tests or measurements:  Height, weight, and BMI.  Blood pressure.  Lipid and cholesterol levels. These may be checked every 5 years, or more frequently if you are over 61 years old.  Skin check.  Lung cancer screening. You may have this screening every year starting at age 37 if you have a 30-pack-year history of smoking and currently smoke or have quit within the past 15 years.  Fecal occult blood test (FOBT) of the stool. You may have this test every year starting at age 70.  Flexible sigmoidoscopy or colonoscopy. You may have a sigmoidoscopy every 5 years or a colonoscopy every 10 years starting at age 13.  Hepatitis C blood test.  Hepatitis B blood test.  Sexually transmitted disease (STD) testing.  Diabetes screening. This is done by checking your blood sugar (glucose) after you have not eaten for a while (fasting). You may have this done every 1-3 years.  Bone density scan. This is done to screen for osteoporosis. You may have this done starting at age 60.  Mammogram. This may be done every 1-2 years. Talk to your health care provider about how often you should have regular mammograms. Talk with your health care provider about your test results, treatment  options, and if necessary, the need for more tests. Vaccines  Your health care provider may recommend certain vaccines, such as:  Influenza vaccine. This is recommended every year.  Tetanus, diphtheria, and acellular pertussis (Tdap, Td) vaccine. You may need a Td booster every 10  years.  Zoster vaccine. You may need this after age 61.  Pneumococcal 13-valent conjugate (PCV13) vaccine. One dose is recommended after age 54.  Pneumococcal polysaccharide (PPSV23) vaccine. One dose is recommended after age 1. Talk to your health care provider about which screenings and vaccines you need and how often you need them. This information is not intended to replace advice given to you by your health care provider. Make sure you discuss any questions you have with your health care provider. Document Released: 07/19/2015 Document Revised: 03/11/2016 Document Reviewed: 04/23/2015 Elsevier Interactive Patient Education  2017 [place] Prevention in the Home Falls can cause injuries. They can happen to people of all ages. There are many things you can do to make your home safe and to help prevent falls. What can I do on the outside of my home?  Regularly fix the edges of walkways and driveways and fix any cracks.  Remove anything that might make you trip as you walk through a door, such as a raised step or threshold.  Trim any bushes or trees on the path to your home.  Use bright outdoor lighting.  Clear any walking paths of anything that might make someone trip, such as rocks or tools.  Regularly check to see if handrails are loose or broken. Make sure that both sides of any steps have handrails.  Any raised decks and porches should have guardrails on the edges.  Have any leaves, snow, or ice cleared regularly.  Use sand or salt on walking paths during winter.  Clean up any spills in your garage right away. This includes oil or grease spills. What can I do in the bathroom?  Use night lights.  Install grab bars by the toilet and in the tub and shower. Do not use towel bars as grab bars.  Use non-skid mats or decals in the tub or shower.  If you need to sit down in the shower, use a plastic, non-slip stool.  Keep the floor dry. Clean up any water that  spills on the floor as soon as it happens.  Remove soap buildup in the tub or shower regularly.  Attach bath mats securely with double-sided non-slip rug tape.  Do not have throw rugs and other things on the floor that can make you trip. What can I do in the bedroom?  Use night lights.  Make sure that you have a light by your bed that is easy to reach.  Do not use any sheets or blankets that are too big for your bed. They should not hang down onto the floor.  Have a firm chair that has side arms. You can use this for support while you get dressed.  Do not have throw rugs and other things on the floor that can make you trip. What can I do in the kitchen?  Clean up any spills right away.  Avoid walking on wet floors.  Keep items that you use a lot in easy-to-reach places.  If you need to reach something above you, use a strong step stool that has a grab bar.  Keep electrical cords out of the way.  Do not use floor polish or wax that makes floors slippery.  If you must use wax, use non-skid floor wax.  Do not have throw rugs and other things on the floor that can make you trip. What can I do with my stairs?  Do not leave any items on the stairs.  Make sure that there are handrails on both sides of the stairs and use them. Fix handrails that are broken or loose. Make sure that handrails are as long as the stairways.  Check any carpeting to make sure that it is firmly attached to the stairs. Fix any carpet that is loose or worn.  Avoid having throw rugs at the top or bottom of the stairs. If you do have throw rugs, attach them to the floor with carpet tape.  Make sure that you have a light switch at the top of the stairs and the bottom of the stairs. If you do not have them, ask someone to add them for you. What else can I do to help prevent falls?  Wear shoes that:  Do not have high heels.  Have rubber bottoms.  Are comfortable and fit you well.  Are closed at the  toe. Do not wear sandals.  If you use a stepladder:  Make sure that it is fully opened. Do not climb a closed stepladder.  Make sure that both sides of the stepladder are locked into place.  Ask someone to hold it for you, if possible.  Clearly mark and make sure that you can see:  Any grab bars or handrails.  First and last steps.  Where the edge of each step is.  Use tools that help you move around (mobility aids) if they are needed. These include:  Canes.  Walkers.  Scooters.  Crutches.  Turn on the lights when you go into a dark area. Replace any light bulbs as soon as they burn out.  Set up your furniture so you have a clear path. Avoid moving your furniture around.  If any of your floors are uneven, fix them.  If there are any pets around you, be aware of where they are.  Review your medicines with your doctor. Some medicines can make you feel dizzy. This can increase your chance of falling. Ask your doctor what other things that you can do to help prevent falls. This information is not intended to replace advice given to you by your health care provider. Make sure you discuss any questions you have with your health care provider. Document Released: 04/18/2009 Document Revised: 11/28/2015 Document Reviewed: 07/27/2014 Elsevier Interactive Patient Education  2017 [employer].

## 2019-08-17 ENCOUNTER — Ambulatory Visit: Payer: Medicare HMO

## 2019-08-18 DIAGNOSIS — C021 Malignant neoplasm of border of tongue: Secondary | ICD-10-CM | POA: Diagnosis not present

## 2019-08-18 DIAGNOSIS — R1310 Dysphagia, unspecified: Secondary | ICD-10-CM | POA: Diagnosis not present

## 2019-08-18 DIAGNOSIS — G893 Neoplasm related pain (acute) (chronic): Secondary | ICD-10-CM | POA: Diagnosis not present

## 2019-08-18 DIAGNOSIS — R634 Abnormal weight loss: Secondary | ICD-10-CM | POA: Diagnosis not present

## 2019-08-21 ENCOUNTER — Encounter: Payer: Self-pay | Admitting: Family Medicine

## 2019-08-22 ENCOUNTER — Telehealth: Payer: Self-pay

## 2019-08-22 NOTE — Telephone Encounter (Signed)
Copied from [place] [id number]. Topic: General - Other >> Aug 22, 2019  3:47 PM Wynetta Emery, [place] C wrote: Reason for CRM:   Pt's daughter called in to be advised. She sees carvedilol (COREG) 12.5 MG tablet as a medication on pt's med list via mychart. She would like to know if pt should be taking medication?        [phone]- Ivin Booty

## 2019-08-22 NOTE — Telephone Encounter (Addendum)
LMOVM for Ivin Booty to return call. Also replied back to an e-mail Ivin Booty sent to office.

## 2019-08-23 DIAGNOSIS — C021 Malignant neoplasm of border of tongue: Secondary | ICD-10-CM | POA: Diagnosis not present

## 2019-08-23 DIAGNOSIS — C029 Malignant neoplasm of tongue, unspecified: Secondary | ICD-10-CM | POA: Diagnosis not present

## 2019-08-23 DIAGNOSIS — Z87891 Personal history of nicotine dependence: Secondary | ICD-10-CM | POA: Diagnosis not present

## 2019-08-23 DIAGNOSIS — C760 Malignant neoplasm of head, face and neck: Secondary | ICD-10-CM | POA: Diagnosis not present

## 2019-08-23 DIAGNOSIS — R918 Other nonspecific abnormal finding of lung field: Secondary | ICD-10-CM | POA: Diagnosis not present

## 2019-08-25 DIAGNOSIS — R1310 Dysphagia, unspecified: Secondary | ICD-10-CM | POA: Diagnosis not present

## 2019-08-25 DIAGNOSIS — R634 Abnormal weight loss: Secondary | ICD-10-CM | POA: Diagnosis not present

## 2019-08-25 DIAGNOSIS — G893 Neoplasm related pain (acute) (chronic): Secondary | ICD-10-CM | POA: Diagnosis not present

## 2019-08-25 DIAGNOSIS — C021 Malignant neoplasm of border of tongue: Secondary | ICD-10-CM | POA: Diagnosis not present

## 2019-08-28 DIAGNOSIS — N189 Chronic kidney disease, unspecified: Secondary | ICD-10-CM | POA: Diagnosis not present

## 2019-08-28 DIAGNOSIS — Z9221 Personal history of antineoplastic chemotherapy: Secondary | ICD-10-CM | POA: Diagnosis not present

## 2019-08-28 DIAGNOSIS — Z8551 Personal history of malignant neoplasm of bladder: Secondary | ICD-10-CM | POA: Diagnosis not present

## 2019-08-28 DIAGNOSIS — Z87891 Personal history of nicotine dependence: Secondary | ICD-10-CM | POA: Diagnosis not present

## 2019-08-28 DIAGNOSIS — C021 Malignant neoplasm of border of tongue: Secondary | ICD-10-CM | POA: Diagnosis not present

## 2019-08-28 DIAGNOSIS — R1310 Dysphagia, unspecified: Secondary | ICD-10-CM | POA: Diagnosis not present

## 2019-08-28 DIAGNOSIS — C029 Malignant neoplasm of tongue, unspecified: Secondary | ICD-10-CM | POA: Diagnosis not present

## 2019-08-29 DIAGNOSIS — C029 Malignant neoplasm of tongue, unspecified: Secondary | ICD-10-CM | POA: Diagnosis not present

## 2019-08-31 ENCOUNTER — Other Ambulatory Visit: Payer: Self-pay | Admitting: Family Medicine

## 2019-08-31 DIAGNOSIS — F32A Depression, unspecified: Secondary | ICD-10-CM

## 2019-08-31 DIAGNOSIS — F419 Anxiety disorder, unspecified: Secondary | ICD-10-CM

## 2019-08-31 DIAGNOSIS — F329 Major depressive disorder, single episode, unspecified: Secondary | ICD-10-CM

## 2019-08-31 NOTE — Telephone Encounter (Signed)
Please advise 90 day supply?

## 2019-08-31 NOTE — Telephone Encounter (Signed)
Request for sertraline 25 mg . Med was filled on 08/08/19 for 30 tabs and 1 refill  Pharmacy is requesting REQUEST FOR 90 DAYS PRESCRIPTION. DX Code Needed.

## 2019-09-01 ENCOUNTER — Other Ambulatory Visit: Payer: Medicare HMO

## 2019-09-01 ENCOUNTER — Ambulatory Visit: Payer: Medicare HMO

## 2019-09-04 DIAGNOSIS — I5022 Chronic systolic (congestive) heart failure: Secondary | ICD-10-CM | POA: Diagnosis not present

## 2019-09-04 DIAGNOSIS — I130 Hypertensive heart and chronic kidney disease with heart failure and stage 1 through stage 4 chronic kidney disease, or unspecified chronic kidney disease: Secondary | ICD-10-CM | POA: Diagnosis not present

## 2019-09-04 DIAGNOSIS — I10 Essential (primary) hypertension: Secondary | ICD-10-CM | POA: Diagnosis not present

## 2019-09-04 DIAGNOSIS — E7800 Pure hypercholesterolemia, unspecified: Secondary | ICD-10-CM | POA: Diagnosis not present

## 2019-09-04 DIAGNOSIS — Q231 Congenital insufficiency of aortic valve: Secondary | ICD-10-CM | POA: Diagnosis not present

## 2019-09-04 DIAGNOSIS — I519 Heart disease, unspecified: Secondary | ICD-10-CM | POA: Diagnosis not present

## 2019-09-05 DIAGNOSIS — R911 Solitary pulmonary nodule: Secondary | ICD-10-CM | POA: Diagnosis not present

## 2019-09-05 DIAGNOSIS — C029 Malignant neoplasm of tongue, unspecified: Secondary | ICD-10-CM | POA: Diagnosis not present

## 2019-09-05 DIAGNOSIS — R5383 Other fatigue: Secondary | ICD-10-CM | POA: Diagnosis not present

## 2019-09-05 DIAGNOSIS — C021 Malignant neoplasm of border of tongue: Secondary | ICD-10-CM | POA: Diagnosis not present

## 2019-09-13 DIAGNOSIS — C021 Malignant neoplasm of border of tongue: Secondary | ICD-10-CM | POA: Diagnosis not present

## 2019-09-13 DIAGNOSIS — Z79899 Other long term (current) drug therapy: Secondary | ICD-10-CM | POA: Diagnosis not present

## 2019-09-13 DIAGNOSIS — I129 Hypertensive chronic kidney disease with stage 1 through stage 4 chronic kidney disease, or unspecified chronic kidney disease: Secondary | ICD-10-CM | POA: Diagnosis not present

## 2019-09-13 DIAGNOSIS — Z87891 Personal history of nicotine dependence: Secondary | ICD-10-CM | POA: Diagnosis not present

## 2019-09-13 DIAGNOSIS — Z5112 Encounter for antineoplastic immunotherapy: Secondary | ICD-10-CM | POA: Diagnosis not present

## 2019-09-13 DIAGNOSIS — N189 Chronic kidney disease, unspecified: Secondary | ICD-10-CM | POA: Diagnosis not present

## 2019-09-13 DIAGNOSIS — Z5111 Encounter for antineoplastic chemotherapy: Secondary | ICD-10-CM | POA: Diagnosis not present

## 2019-09-16 ENCOUNTER — Emergency Department
Admission: EM | Admit: 2019-09-16 | Discharge: 2019-09-16 | Disposition: A | Discharge status: Skilled Nursing Facility | Payer: Medicare HMO | Attending: Emergency Medicine | Admitting: Emergency Medicine

## 2019-09-16 ENCOUNTER — Emergency Department: Payer: Medicare HMO

## 2019-09-16 ENCOUNTER — Other Ambulatory Visit: Payer: Self-pay

## 2019-09-16 DIAGNOSIS — R630 Anorexia: Secondary | ICD-10-CM | POA: Insufficient documentation

## 2019-09-16 DIAGNOSIS — I10 Essential (primary) hypertension: Secondary | ICD-10-CM | POA: Diagnosis not present

## 2019-09-16 DIAGNOSIS — K146 Glossodynia: Secondary | ICD-10-CM | POA: Insufficient documentation

## 2019-09-16 DIAGNOSIS — E860 Dehydration: Secondary | ICD-10-CM | POA: Diagnosis not present

## 2019-09-16 DIAGNOSIS — R5381 Other malaise: Secondary | ICD-10-CM | POA: Diagnosis not present

## 2019-09-16 DIAGNOSIS — I509 Heart failure, unspecified: Secondary | ICD-10-CM | POA: Diagnosis not present

## 2019-09-16 DIAGNOSIS — I130 Hypertensive heart and chronic kidney disease with heart failure and stage 1 through stage 4 chronic kidney disease, or unspecified chronic kidney disease: Secondary | ICD-10-CM | POA: Diagnosis not present

## 2019-09-16 DIAGNOSIS — R531 Weakness: Secondary | ICD-10-CM | POA: Diagnosis not present

## 2019-09-16 DIAGNOSIS — N183 Chronic kidney disease, stage 3 unspecified: Secondary | ICD-10-CM | POA: Insufficient documentation

## 2019-09-16 DIAGNOSIS — Z79899 Other long term (current) drug therapy: Secondary | ICD-10-CM | POA: Insufficient documentation

## 2019-09-16 DIAGNOSIS — C021 Malignant neoplasm of border of tongue: Secondary | ICD-10-CM | POA: Insufficient documentation

## 2019-09-16 DIAGNOSIS — Z20822 Contact with and (suspected) exposure to covid-19: Secondary | ICD-10-CM | POA: Insufficient documentation

## 2019-09-16 LAB — URINALYSIS, COMPLETE (UACMP) WITH MICROSCOPIC
Bacteria, UA: NONE SEEN
Bilirubin Urine: NEGATIVE
Glucose, UA: NEGATIVE mg/dL
Hgb urine dipstick: NEGATIVE
Ketones, ur: NEGATIVE mg/dL
Leukocytes,Ua: NEGATIVE
Nitrite: NEGATIVE
Protein, ur: NEGATIVE mg/dL
Specific Gravity, Urine: 1.014 (ref 1.005–1.030)
pH: 6 (ref 5.0–8.0)

## 2019-09-16 LAB — COMPREHENSIVE METABOLIC PANEL
ALT: 16 U/L (ref 0–44)
AST: 21 U/L (ref 15–41)
Albumin: 3.4 g/dL — ABNORMAL LOW (ref 3.5–5.0)
Alkaline Phosphatase: 125 U/L (ref 38–126)
Anion gap: 11 (ref 5–15)
BUN: 71 mg/dL — ABNORMAL HIGH (ref 8–23)
CO2: 26 mmol/L (ref 22–32)
Calcium: 8.9 mg/dL (ref 8.9–10.3)
Chloride: 96 mmol/L — ABNORMAL LOW (ref 98–111)
Creatinine, Ser: 1.12 mg/dL — ABNORMAL HIGH (ref 0.44–1.00)
GFR calc Af Amer: 51 mL/min — ABNORMAL LOW (ref >60)
GFR calc non Af Amer: 44 mL/min — ABNORMAL LOW (ref >60)
Glucose, Bld: 105 mg/dL — ABNORMAL HIGH (ref 70–99)
Potassium: 4.6 mmol/L (ref 3.5–5.1)
Sodium: 133 mmol/L — ABNORMAL LOW (ref 135–145)
Total Bilirubin: 1 mg/dL (ref 0.3–1.2)
Total Protein: 7 g/dL (ref 6.5–8.1)

## 2019-09-16 LAB — CBC WITH DIFFERENTIAL/PLATELET
Abs Immature Granulocytes: 0.1 10*3/uL — ABNORMAL HIGH (ref 0.00–0.07)
Basophils Absolute: 0.1 10*3/uL (ref 0.0–0.1)
Basophils Relative: 0 %
Eosinophils Absolute: 0.1 10*3/uL (ref 0.0–0.5)
Eosinophils Relative: 0 %
HCT: 40.3 % (ref 36.0–46.0)
Hemoglobin: 13.1 g/dL (ref 12.0–15.0)
Immature Granulocytes: 0 %
Lymphocytes Relative: 8 %
Lymphs Abs: 1.9 10*3/uL (ref 0.7–4.0)
MCH: 31.5 pg (ref 26.0–34.0)
MCHC: 32.5 g/dL (ref 30.0–36.0)
MCV: 96.9 fL (ref 80.0–100.0)
Monocytes Absolute: 0.4 10*3/uL (ref 0.1–1.0)
Monocytes Relative: 2 %
Neutro Abs: 20 10*3/uL — ABNORMAL HIGH (ref 1.7–7.7)
Neutrophils Relative %: 90 %
Platelets: 281 10*3/uL (ref 150–400)
RBC: 4.16 MIL/uL (ref 3.87–5.11)
RDW: 13.6 % (ref 11.5–15.5)
WBC: 22.6 10*3/uL — ABNORMAL HIGH (ref 4.0–10.5)
nRBC: 0 % (ref 0.0–0.2)

## 2019-09-16 LAB — LACTIC ACID, PLASMA: Lactic Acid, Venous: 1.5 mmol/L (ref 0.5–1.9)

## 2019-09-16 MED ORDER — SODIUM CHLORIDE 0.9 % IV BOLUS
1000.0000 mL | Freq: Once | INTRAVENOUS | Status: AC
  Administered 2019-09-16: 1000 mL via INTRAVENOUS

## 2019-09-16 MED ORDER — MORPHINE SULFATE (PF) 4 MG/ML IV SOLN
4.0000 mg | Freq: Once | INTRAVENOUS | Status: AC
  Administered 2019-09-16: 4 mg via INTRAVENOUS
  Filled 2019-09-16: qty 1

## 2019-09-16 MED ORDER — LIDOCAINE VISCOUS HCL 2 % MT SOLN
15.0000 mL | Freq: Once | OROMUCOSAL | Status: AC
  Administered 2019-09-16: 15 mL via OROMUCOSAL
  Filled 2019-09-16: qty 15

## 2019-09-16 MED ORDER — ONDANSETRON HCL 4 MG/2ML IJ SOLN
4.0000 mg | Freq: Once | INTRAMUSCULAR | Status: AC
  Administered 2019-09-16: 4 mg via INTRAVENOUS
  Filled 2019-09-16: qty 2

## 2019-09-16 NOTE — ED Triage Notes (Signed)
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

## 2019-09-16 NOTE — ED Notes (Addendum)
Incorrect chart

## 2019-09-16 NOTE — ED Notes (Signed)
Daughter Erin Ibarra informed that pt will be discharged, she states she is going to check with family to see if someone can come and pick her up. Advised her that I would let her know when pt was up for discharge

## 2019-09-16 NOTE — ED Notes (Signed)
Pt assisted to bedpan by this RN, pt able to assist with placement by lifting hips.

## 2019-09-16 NOTE — ED Notes (Signed)
Report to April, RN

## 2019-09-16 NOTE — ED Provider Notes (Signed)
[facility] Emergency Department Provider Note  ____________________________________________  Time seen: Approximately 1:06 PM  I have reviewed the triage vital signs and the nursing notes.   HISTORY  Chief Complaint Weakness    HPI Erin Ibarra is a 86 y.o. female who presents to the emergency department for complaint of weakness, general malaise, pain in her tongue.  Patient states that she has a history of tongue cancer, and is receiving treatment currently from [facility].  Patient states that her tongue is so painful she is not been able to eat or drink appropriately.  Parents are concerned that the patient may be dehydrated.  Patient denies any other complaints and other than her tongue hurting, having little appetite, feeling weak.  Patient denies any fevers, cough, shortness of breath, chest pain, abdominal pain, nausea vomiting, diarrhea or constipation.  No dysuria, polyuria, hematuria.         Past Medical History:  Diagnosis Date  . Bladder cancer ([place]) 1991   Hx TUR and chemo tx's.   . Chronic kidney disease   . Glaucoma   . Heart murmur   . Hypertension   . Renal insufficiency    Hx Stage 3 kidney disease.  . Thyroid disease   . Tongue cancer [facility])     Patient Active Problem List   Diagnosis Date Noted  . UTI (urinary tract infection) 03/16/2019  . MCI (mild cognitive impairment) with memory loss 12/23/2018  . Vasovagal syncope 11/03/2017  . Closed fracture of patella 07/21/2017  . Benign hypertensive heart and CKD, stage 3 (GFR 30-59), w CHF ([place]) 09/09/2015  . Hyperkalemia 09/09/2015  . Amblyopia 09/06/2015  . Dorsalgia 09/06/2015  . Aortic valve, bicuspid 09/06/2015  . Bladder cancer ([place]) 09/06/2015  . Colon polyp 09/06/2015  . DD (diverticular disease) 09/06/2015  . Breathlessness on exertion 09/06/2015  . Hypercholesteremia 09/06/2015  . Below normal amount of sodium in the blood 09/06/2015  . Cervical pain 09/06/2015   . Amnesia 09/06/2015  . Post menopausal syndrome 09/06/2015  . Disorder of scalp 09/06/2015  . Hypo-osmolality and hyponatremia 09/06/2015  . Diverticulitis 09/06/2015  . Aortic regurgitation, congenital 09/06/2015  . OP (osteoporosis) 05/20/2015  . Age-related osteoporosis without current pathological fracture 05/20/2015  . Hypothyroidism 12/26/2014  . Encounter for screening colonoscopy 08/29/2014  . BP (high blood pressure) 02/03/2013  . Difficulty hearing 02/03/2013  . Cataract 10/14/2012  . Central retinal vein occlusion 10/14/2012  . 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 Cardiac failure ([place]) 06/21/2008  . Heart failure ([place]) 06/21/2008    Past Surgical History:  Procedure Laterality Date  . broke knee cap    . CATARACT EXTRACTION  2015  . COLONOSCOPY  2006  . TRANSURETHRAL RESECTION OF BLADDER  1991    Prior to Admission medications   Medication Sig Start Date End Date Taking? Authorizing Provider  amoxicillin (AMOXIL) 500 MG capsule amoxicillin 500 mg capsule  TAKE 4 CAPSULES BY MOUTH WITH FOOD 1 HOUR PRIOR TO APPOINTMENT    [provider]  aspirin 81 MG tablet Take 81 mg by mouth daily.    [provider]  brimonidine (ALPHAGAN) 0.2 % ophthalmic solution Place 1 drop into the right eye 2 (two) times daily.  02/24/17   [provider]  Calcium Carbonate-Vit D-Min (CALCIUM 600 + MINERALS PO) Take 1 tablet by mouth daily.    [provider]  carvedilol (COREG) 12.5 MG tablet Take 12.5 mg by mouth daily.  05/04/19   [provider]  dorzolamide-timolol (COSOPT) 22.3-6.8 MG/ML ophthalmic solution Place 1 drop into both eyes 2 (two) times daily.  08/19/15   [provider]  Fish Oil-Cholecalciferol (FISH OIL + D3) 1000-1000 MG-UNIT CAPS Take by mouth daily.     [provider]  hydrochlorothiazide (HYDRODIURIL) 25 MG tablet Take 1 tablet (25 mg total) by mouth daily. 05/25/19   Jerrol Banana., MD   latanoprost (XALATAN) 0.005 % ophthalmic solution INSTILL 1 DROP INTO RIGHT EYE AT BEDTIME 08/26/18   [provider]  levothyroxine (SYNTHROID) 75 MCG tablet Take 1 tablet (75 mcg total) by mouth daily before breakfast. 07/20/19   Jerrol Banana., MD  lisinopril (ZESTRIL) 40 MG tablet Take 40 mg by mouth daily.  04/04/19   [provider]  memantine (NAMENDA) 5 MG tablet Take 1 tablet (5 mg total) by mouth 2 (two) times daily. Patient taking differently: Take 5 mg by mouth daily.  05/18/19   Jerrol Banana., MD  midodrine (PROAMATINE) 2.5 MG tablet Take 1 tablet (2.5 mg total) by mouth 3 (three) times daily with meals. 03/19/19   Loletha Grayer, MD  Multiple Vitamins-Minerals (MULTIVITAMIN WITH MINERALS) tablet Take 1 tablet by mouth daily.    [provider]  Omega-3 1000 MG CAPS Take by mouth.    [provider]  ROCKLATAN 0.02-0.005 % SOLN Place 1 drop into the right eye every evening. 12/26/18   [provider]  sertraline (ZOLOFT) 25 MG tablet TAKE 1 TABLET BY MOUTH EVERY DAY 08/31/19   Jerrol Banana., MD    Allergies Patient has no known allergies.  Family History  Problem Relation Age of Onset  . Prostate cancer Father   . Cancer Father        prostate  . Heart disease Father        MI  . Pancreatic cancer Brother   . Lung cancer Brother   . Cancer Brother        brain, lung  . Heart disease Brother        MI    Social History Social History   Tobacco Use  . Smoking status: Former Smoker    Packs/day: 1.00    Years: 15.00    Pack years: 15.00    Types: Cigarettes  . Smokeless tobacco: Never Used  . Tobacco comment: > 15 years ago  Substance Use Topics  . Alcohol use: Not Currently    Alcohol/week: 0.0 standard drinks  . Drug use: No     Review of Systems  Constitutional: No fever/chills. Malaise and weakness Eyes: No visual changes. No discharge ENT: Tongue pain preventing her from eating and  drinking appropriately Cardiovascular: no chest pain. Respiratory: no cough. No SOB. Gastrointestinal: No abdominal pain.  No nausea, no vomiting.  No diarrhea.  No constipation. Genitourinary: Negative for dysuria. No hematuria Musculoskeletal: Negative for musculoskeletal pain. Skin: Negative for rash, abrasions, lacerations, ecchymosis. Neurological: Negative for headaches, focal weakness or numbness. 10-point ROS otherwise negative.  ____________________________________________   PHYSICAL EXAM:  VITAL SIGNS: ED Triage Vitals  Enc Vitals Group     BP      Pulse      Resp      Temp      Temp src      SpO2      Weight      Height      Head Circumference      Peak Flow      Pain Score  Pain Loc      Pain Edu?      Excl. in [place]?      Constitutional: Alert and oriented. Well appearing and in no acute distress. Eyes: Conjunctivae are normal. PERRL. EOMI. Head: Atraumatic. ENT:      Ears:       Nose: No congestion/rhinnorhea.      Mouth/Throat: Mucous membranes are moist. Tongue is slightly edematous but no frank angioedema. Tongue with some fissuring. Large white plaquelike lesion noted along R border consistent with known squamous cell carcinoma. Neck: No stridor.   Hematological/Lymphatic/Immunilogical: No cervical lymphadenopathy. Cardiovascular: Normal rate, regular rhythm. Normal S1 and S2.  Good peripheral circulation. Respiratory: Normal respiratory effort without tachypnea or retractions. Lungs CTAB. Good air entry to the bases with no decreased or absent breath sounds. Gastrointestinal: Bowel sounds 4 quadrants. Soft and nontender to palpation. No guarding or rigidity. No palpable masses. No distention.  Musculoskeletal: Full range of motion to all extremities. No gross deformities appreciated. Neurologic:  Normal speech and language. No gross focal neurologic deficits are appreciated.  Skin:  Skin is warm, dry and intact. No rash noted. Psychiatric: Mood  and affect are normal. Speech and behavior are normal. Patient exhibits appropriate insight and judgement.   ____________________________________________   LABS (all labs ordered are listed, but only abnormal results are displayed)  Labs Reviewed  COMPREHENSIVE METABOLIC PANEL - Abnormal; Notable for the following components:      Result Value   Sodium 133 (*)    Chloride 96 (*)    Glucose, Bld 105 (*)    BUN 71 (*)    Creatinine, Ser 1.12 (*)    Albumin 3.4 (*)    GFR calc non Af Amer 44 (*)    GFR calc Af Amer 51 (*)    All other components within normal limits  CBC WITH DIFFERENTIAL/PLATELET - Abnormal; Notable for the following components:   WBC 22.6 (*)    Neutro Abs 20.0 (*)    Abs Immature Granulocytes 0.10 (*)    All other components within normal limits  URINALYSIS, COMPLETE (UACMP) WITH MICROSCOPIC - Abnormal; Notable for the following components:   Color, Urine YELLOW (*)    APPearance CLEAR (*)    All other components within normal limits  SARS CORONAVIRUS 2 (TAT 6-24 HRS)  LACTIC ACID, PLASMA  LACTIC ACID, PLASMA   ____________________________________________  EKG   ____________________________________________  RADIOLOGY I personally viewed and evaluated these images as part of my medical decision making, as well as reviewing the written report by the radiologist.  DG Chest 1 View  Result Date: 09/16/2019 CLINICAL DATA:  86 year old female with weakness. Currently undergoing chemotherapy. EXAM: CHEST  1 VIEW COMPARISON:  06/10/2017 chest radiograph FINDINGS: The cardiomediastinal silhouette is unremarkable. There is no evidence of focal airspace disease, pulmonary edema, suspicious pulmonary nodule/mass, pleural effusion, or pneumothorax. No acute bony abnormalities are identified. IMPRESSION: No active disease. Electronically Signed   By: Margarette Canada M.D.   On: 09/16/2019 14:00     ____________________________________________    PROCEDURES  Procedure(s) performed:    Procedures    Medications  sodium chloride 0.9 % bolus 1,000 mL (0 mLs Intravenous Stopped 09/16/19 1601)  ondansetron (ZOFRAN) injection 4 mg (4 mg Intravenous Given 09/16/19 1352)  morphine 4 MG/ML injection 4 mg (4 mg Intravenous Given 09/16/19 1352)  lidocaine (XYLOCAINE) 2 % viscous mouth solution 15 mL (15 mLs Mouth/Throat Given 09/16/19 1352)  sodium chloride 0.9 % bolus 1,000 mL (1,000 mLs  Intravenous New Bag/Given 09/16/19 1559)     ____________________________________________   INITIAL IMPRESSION / ASSESSMENT AND PLAN / ED COURSE  Pertinent labs & imaging results that were available during my care of the patient were reviewed by me and considered in my medical decision making (see chart for details).  Review of the [place] CSRS was performed in accordance of the [place] prior to dispensing any controlled drugs.           Patient's diagnosis is consistent with dehydration, oral cancer.  Patient presented to the emergency department complaining of inability to eat given mouth pain.  Patient is currently undergoing chemotherapy at [facility] for oral cancer.  Patient has findings in her mouth consistent with oral cancer, indefinite source of pain.  Patient has not ate or drink since her last chemo treatments.  Patient was given 2 L of IV fluid, feels much improved at this time.  I discussed admission versus discharge with the patient and she request to be discharged.  Patient work-up was overall reassuring.  She does have an elevation in her white blood cell count, likely secondary to Decadron administration 2 days ago.  No other reported sources of infection, and again work-up was otherwise reassuring.  At this time I feel it is reasonable to let patient be discharged home.  Patient is well aware of return precautions.  Patient states that she has medication at home with pain medication as well as  topical medication for her mouth pain.  She will follow up with oncology or primary care as needed.. Patient is given ED precautions to return to the ED for any worsening or new symptoms.     ____________________________________________  FINAL CLINICAL IMPRESSION(S) / ED DIAGNOSES  Final diagnoses:  Dehydration  Generalized weakness  Squamous cell carcinoma, tongue border (HCC)      NEW MEDICATIONS STARTED DURING THIS VISIT:  ED Discharge Orders    None          This chart was dictated using voice recognition software/Dragon. Despite best efforts to proofread, errors can occur which can change the meaning. Any change was purely unintentional.    Darletta Moll, PA-C 09/16/19 Dionicia Abler    Lavonia Drafts, MD 09/17/19 1750

## 2019-09-16 NOTE — ED Notes (Signed)
Pt's daughter sharon updated on pt's status and plan of care

## 2019-09-17 LAB — SARS CORONAVIRUS 2 (TAT 6-24 HRS): SARS Coronavirus 2: NEGATIVE

## 2019-09-20 ENCOUNTER — Ambulatory Visit: Payer: Self-pay | Admitting: Family Medicine

## 2019-09-20 DIAGNOSIS — T451X5A Adverse effect of antineoplastic and immunosuppressive drugs, initial encounter: Secondary | ICD-10-CM | POA: Diagnosis not present

## 2019-09-20 DIAGNOSIS — Z79899 Other long term (current) drug therapy: Secondary | ICD-10-CM | POA: Diagnosis not present

## 2019-09-20 DIAGNOSIS — C021 Malignant neoplasm of border of tongue: Secondary | ICD-10-CM | POA: Diagnosis not present

## 2019-09-20 DIAGNOSIS — D701 Agranulocytosis secondary to cancer chemotherapy: Secondary | ICD-10-CM | POA: Diagnosis not present

## 2019-09-20 DIAGNOSIS — R5383 Other fatigue: Secondary | ICD-10-CM | POA: Diagnosis not present

## 2019-09-25 DIAGNOSIS — I10 Essential (primary) hypertension: Secondary | ICD-10-CM | POA: Diagnosis not present

## 2019-09-25 DIAGNOSIS — C7800 Secondary malignant neoplasm of unspecified lung: Secondary | ICD-10-CM | POA: Diagnosis not present

## 2019-09-25 DIAGNOSIS — F39 Unspecified mood [affective] disorder: Secondary | ICD-10-CM | POA: Diagnosis not present

## 2019-09-25 DIAGNOSIS — E440 Moderate protein-calorie malnutrition: Secondary | ICD-10-CM | POA: Diagnosis not present

## 2019-09-25 DIAGNOSIS — K140 Glossitis: Secondary | ICD-10-CM | POA: Diagnosis not present

## 2019-09-25 DIAGNOSIS — C029 Malignant neoplasm of tongue, unspecified: Secondary | ICD-10-CM | POA: Diagnosis not present

## 2019-09-26 DIAGNOSIS — N183 Chronic kidney disease, stage 3 unspecified: Secondary | ICD-10-CM | POA: Diagnosis not present

## 2019-09-26 DIAGNOSIS — H409 Unspecified glaucoma: Secondary | ICD-10-CM | POA: Diagnosis not present

## 2019-09-26 DIAGNOSIS — R011 Cardiac murmur, unspecified: Secondary | ICD-10-CM | POA: Diagnosis not present

## 2019-09-26 DIAGNOSIS — C029 Malignant neoplasm of tongue, unspecified: Secondary | ICD-10-CM | POA: Diagnosis not present

## 2019-09-26 DIAGNOSIS — I129 Hypertensive chronic kidney disease with stage 1 through stage 4 chronic kidney disease, or unspecified chronic kidney disease: Secondary | ICD-10-CM | POA: Diagnosis not present

## 2019-09-26 DIAGNOSIS — M6281 Muscle weakness (generalized): Secondary | ICD-10-CM | POA: Diagnosis not present

## 2019-09-26 DIAGNOSIS — R262 Difficulty in walking, not elsewhere classified: Secondary | ICD-10-CM | POA: Diagnosis not present

## 2019-09-27 DIAGNOSIS — R011 Cardiac murmur, unspecified: Secondary | ICD-10-CM | POA: Diagnosis not present

## 2019-09-27 DIAGNOSIS — M6281 Muscle weakness (generalized): Secondary | ICD-10-CM | POA: Diagnosis not present

## 2019-09-27 DIAGNOSIS — C029 Malignant neoplasm of tongue, unspecified: Secondary | ICD-10-CM | POA: Diagnosis not present

## 2019-09-27 DIAGNOSIS — R262 Difficulty in walking, not elsewhere classified: Secondary | ICD-10-CM | POA: Diagnosis not present

## 2019-09-27 DIAGNOSIS — I129 Hypertensive chronic kidney disease with stage 1 through stage 4 chronic kidney disease, or unspecified chronic kidney disease: Secondary | ICD-10-CM | POA: Diagnosis not present

## 2019-09-27 DIAGNOSIS — N183 Chronic kidney disease, stage 3 unspecified: Secondary | ICD-10-CM | POA: Diagnosis not present

## 2019-09-27 DIAGNOSIS — H409 Unspecified glaucoma: Secondary | ICD-10-CM | POA: Diagnosis not present

## 2019-09-29 DIAGNOSIS — H409 Unspecified glaucoma: Secondary | ICD-10-CM | POA: Diagnosis not present

## 2019-09-29 DIAGNOSIS — I129 Hypertensive chronic kidney disease with stage 1 through stage 4 chronic kidney disease, or unspecified chronic kidney disease: Secondary | ICD-10-CM | POA: Diagnosis not present

## 2019-09-29 DIAGNOSIS — N183 Chronic kidney disease, stage 3 unspecified: Secondary | ICD-10-CM | POA: Diagnosis not present

## 2019-09-29 DIAGNOSIS — C029 Malignant neoplasm of tongue, unspecified: Secondary | ICD-10-CM | POA: Diagnosis not present

## 2019-09-29 DIAGNOSIS — R011 Cardiac murmur, unspecified: Secondary | ICD-10-CM | POA: Diagnosis not present

## 2019-09-29 DIAGNOSIS — M6281 Muscle weakness (generalized): Secondary | ICD-10-CM | POA: Diagnosis not present

## 2019-09-29 DIAGNOSIS — R262 Difficulty in walking, not elsewhere classified: Secondary | ICD-10-CM | POA: Diagnosis not present

## 2019-10-03 DIAGNOSIS — H409 Unspecified glaucoma: Secondary | ICD-10-CM | POA: Diagnosis not present

## 2019-10-03 DIAGNOSIS — R262 Difficulty in walking, not elsewhere classified: Secondary | ICD-10-CM | POA: Diagnosis not present

## 2019-10-03 DIAGNOSIS — C029 Malignant neoplasm of tongue, unspecified: Secondary | ICD-10-CM | POA: Diagnosis not present

## 2019-10-03 DIAGNOSIS — R011 Cardiac murmur, unspecified: Secondary | ICD-10-CM | POA: Diagnosis not present

## 2019-10-03 DIAGNOSIS — I129 Hypertensive chronic kidney disease with stage 1 through stage 4 chronic kidney disease, or unspecified chronic kidney disease: Secondary | ICD-10-CM | POA: Diagnosis not present

## 2019-10-03 DIAGNOSIS — M6281 Muscle weakness (generalized): Secondary | ICD-10-CM | POA: Diagnosis not present

## 2019-10-03 DIAGNOSIS — N183 Chronic kidney disease, stage 3 unspecified: Secondary | ICD-10-CM | POA: Diagnosis not present

## 2019-10-04 DIAGNOSIS — R011 Cardiac murmur, unspecified: Secondary | ICD-10-CM | POA: Diagnosis not present

## 2019-10-04 DIAGNOSIS — M6281 Muscle weakness (generalized): Secondary | ICD-10-CM | POA: Diagnosis not present

## 2019-10-04 DIAGNOSIS — I129 Hypertensive chronic kidney disease with stage 1 through stage 4 chronic kidney disease, or unspecified chronic kidney disease: Secondary | ICD-10-CM | POA: Diagnosis not present

## 2019-10-04 DIAGNOSIS — R262 Difficulty in walking, not elsewhere classified: Secondary | ICD-10-CM | POA: Diagnosis not present

## 2019-10-04 DIAGNOSIS — H409 Unspecified glaucoma: Secondary | ICD-10-CM | POA: Diagnosis not present

## 2019-10-04 DIAGNOSIS — C029 Malignant neoplasm of tongue, unspecified: Secondary | ICD-10-CM | POA: Diagnosis not present

## 2019-10-04 DIAGNOSIS — N183 Chronic kidney disease, stage 3 unspecified: Secondary | ICD-10-CM | POA: Diagnosis not present

## 2019-10-05 DIAGNOSIS — N183 Chronic kidney disease, stage 3 unspecified: Secondary | ICD-10-CM | POA: Diagnosis not present

## 2019-10-05 DIAGNOSIS — H409 Unspecified glaucoma: Secondary | ICD-10-CM | POA: Diagnosis not present

## 2019-10-05 DIAGNOSIS — I129 Hypertensive chronic kidney disease with stage 1 through stage 4 chronic kidney disease, or unspecified chronic kidney disease: Secondary | ICD-10-CM | POA: Diagnosis not present

## 2019-10-05 DIAGNOSIS — C029 Malignant neoplasm of tongue, unspecified: Secondary | ICD-10-CM | POA: Diagnosis not present

## 2019-10-05 DIAGNOSIS — R011 Cardiac murmur, unspecified: Secondary | ICD-10-CM | POA: Diagnosis not present

## 2019-10-05 DIAGNOSIS — M6281 Muscle weakness (generalized): Secondary | ICD-10-CM | POA: Diagnosis not present

## 2019-10-05 DIAGNOSIS — R262 Difficulty in walking, not elsewhere classified: Secondary | ICD-10-CM | POA: Diagnosis not present

## 2019-10-10 DIAGNOSIS — H409 Unspecified glaucoma: Secondary | ICD-10-CM | POA: Diagnosis not present

## 2019-10-10 DIAGNOSIS — M6281 Muscle weakness (generalized): Secondary | ICD-10-CM | POA: Diagnosis not present

## 2019-10-10 DIAGNOSIS — I129 Hypertensive chronic kidney disease with stage 1 through stage 4 chronic kidney disease, or unspecified chronic kidney disease: Secondary | ICD-10-CM | POA: Diagnosis not present

## 2019-10-10 DIAGNOSIS — N183 Chronic kidney disease, stage 3 unspecified: Secondary | ICD-10-CM | POA: Diagnosis not present

## 2019-10-10 DIAGNOSIS — R011 Cardiac murmur, unspecified: Secondary | ICD-10-CM | POA: Diagnosis not present

## 2019-10-10 DIAGNOSIS — R262 Difficulty in walking, not elsewhere classified: Secondary | ICD-10-CM | POA: Diagnosis not present

## 2019-10-10 DIAGNOSIS — C029 Malignant neoplasm of tongue, unspecified: Secondary | ICD-10-CM | POA: Diagnosis not present

## 2019-10-11 DIAGNOSIS — R011 Cardiac murmur, unspecified: Secondary | ICD-10-CM | POA: Diagnosis not present

## 2019-10-11 DIAGNOSIS — Z79899 Other long term (current) drug therapy: Secondary | ICD-10-CM | POA: Diagnosis not present

## 2019-10-11 DIAGNOSIS — H409 Unspecified glaucoma: Secondary | ICD-10-CM | POA: Diagnosis not present

## 2019-10-11 DIAGNOSIS — C021 Malignant neoplasm of border of tongue: Secondary | ICD-10-CM | POA: Diagnosis not present

## 2019-10-11 DIAGNOSIS — Z5112 Encounter for antineoplastic immunotherapy: Secondary | ICD-10-CM | POA: Diagnosis not present

## 2019-10-11 DIAGNOSIS — D701 Agranulocytosis secondary to cancer chemotherapy: Secondary | ICD-10-CM | POA: Diagnosis not present

## 2019-10-11 DIAGNOSIS — N183 Chronic kidney disease, stage 3 unspecified: Secondary | ICD-10-CM | POA: Diagnosis not present

## 2019-10-11 DIAGNOSIS — I129 Hypertensive chronic kidney disease with stage 1 through stage 4 chronic kidney disease, or unspecified chronic kidney disease: Secondary | ICD-10-CM | POA: Diagnosis not present

## 2019-10-11 DIAGNOSIS — C029 Malignant neoplasm of tongue, unspecified: Secondary | ICD-10-CM | POA: Diagnosis not present

## 2019-10-11 DIAGNOSIS — R262 Difficulty in walking, not elsewhere classified: Secondary | ICD-10-CM | POA: Diagnosis not present

## 2019-10-11 DIAGNOSIS — T451X5A Adverse effect of antineoplastic and immunosuppressive drugs, initial encounter: Secondary | ICD-10-CM | POA: Diagnosis not present

## 2019-10-11 DIAGNOSIS — M6281 Muscle weakness (generalized): Secondary | ICD-10-CM | POA: Diagnosis not present

## 2019-10-12 ENCOUNTER — Other Ambulatory Visit: Payer: Self-pay | Admitting: Family Medicine

## 2019-10-16 DIAGNOSIS — C7800 Secondary malignant neoplasm of unspecified lung: Secondary | ICD-10-CM

## 2019-10-16 DIAGNOSIS — E441 Mild protein-calorie malnutrition: Secondary | ICD-10-CM

## 2019-10-16 DIAGNOSIS — N183 Chronic kidney disease, stage 3 unspecified: Secondary | ICD-10-CM | POA: Diagnosis not present

## 2019-10-16 DIAGNOSIS — R011 Cardiac murmur, unspecified: Secondary | ICD-10-CM | POA: Diagnosis not present

## 2019-10-16 DIAGNOSIS — M6281 Muscle weakness (generalized): Secondary | ICD-10-CM | POA: Diagnosis not present

## 2019-10-16 DIAGNOSIS — H409 Unspecified glaucoma: Secondary | ICD-10-CM | POA: Diagnosis not present

## 2019-10-16 DIAGNOSIS — I129 Hypertensive chronic kidney disease with stage 1 through stage 4 chronic kidney disease, or unspecified chronic kidney disease: Secondary | ICD-10-CM | POA: Diagnosis not present

## 2019-10-16 DIAGNOSIS — R262 Difficulty in walking, not elsewhere classified: Secondary | ICD-10-CM | POA: Diagnosis not present

## 2019-10-16 DIAGNOSIS — F39 Unspecified mood [affective] disorder: Secondary | ICD-10-CM

## 2019-10-16 DIAGNOSIS — C029 Malignant neoplasm of tongue, unspecified: Secondary | ICD-10-CM

## 2019-10-16 DIAGNOSIS — I10 Essential (primary) hypertension: Secondary | ICD-10-CM

## 2019-10-17 DIAGNOSIS — R011 Cardiac murmur, unspecified: Secondary | ICD-10-CM | POA: Diagnosis not present

## 2019-10-17 DIAGNOSIS — N183 Chronic kidney disease, stage 3 unspecified: Secondary | ICD-10-CM | POA: Diagnosis not present

## 2019-10-17 DIAGNOSIS — C029 Malignant neoplasm of tongue, unspecified: Secondary | ICD-10-CM | POA: Diagnosis not present

## 2019-10-17 DIAGNOSIS — M6281 Muscle weakness (generalized): Secondary | ICD-10-CM | POA: Diagnosis not present

## 2019-10-17 DIAGNOSIS — H409 Unspecified glaucoma: Secondary | ICD-10-CM | POA: Diagnosis not present

## 2019-10-17 DIAGNOSIS — R262 Difficulty in walking, not elsewhere classified: Secondary | ICD-10-CM | POA: Diagnosis not present

## 2019-10-17 DIAGNOSIS — I129 Hypertensive chronic kidney disease with stage 1 through stage 4 chronic kidney disease, or unspecified chronic kidney disease: Secondary | ICD-10-CM | POA: Diagnosis not present

## 2019-10-18 DIAGNOSIS — C029 Malignant neoplasm of tongue, unspecified: Secondary | ICD-10-CM | POA: Diagnosis not present

## 2019-10-18 DIAGNOSIS — I129 Hypertensive chronic kidney disease with stage 1 through stage 4 chronic kidney disease, or unspecified chronic kidney disease: Secondary | ICD-10-CM | POA: Diagnosis not present

## 2019-10-18 DIAGNOSIS — R011 Cardiac murmur, unspecified: Secondary | ICD-10-CM | POA: Diagnosis not present

## 2019-10-18 DIAGNOSIS — M6281 Muscle weakness (generalized): Secondary | ICD-10-CM | POA: Diagnosis not present

## 2019-10-18 DIAGNOSIS — N183 Chronic kidney disease, stage 3 unspecified: Secondary | ICD-10-CM | POA: Diagnosis not present

## 2019-10-18 DIAGNOSIS — H409 Unspecified glaucoma: Secondary | ICD-10-CM | POA: Diagnosis not present

## 2019-10-18 DIAGNOSIS — R262 Difficulty in walking, not elsewhere classified: Secondary | ICD-10-CM | POA: Diagnosis not present

## 2019-10-19 DIAGNOSIS — R011 Cardiac murmur, unspecified: Secondary | ICD-10-CM | POA: Diagnosis not present

## 2019-10-19 DIAGNOSIS — M6281 Muscle weakness (generalized): Secondary | ICD-10-CM | POA: Diagnosis not present

## 2019-10-19 DIAGNOSIS — N183 Chronic kidney disease, stage 3 unspecified: Secondary | ICD-10-CM | POA: Diagnosis not present

## 2019-10-19 DIAGNOSIS — R262 Difficulty in walking, not elsewhere classified: Secondary | ICD-10-CM | POA: Diagnosis not present

## 2019-10-19 DIAGNOSIS — I129 Hypertensive chronic kidney disease with stage 1 through stage 4 chronic kidney disease, or unspecified chronic kidney disease: Secondary | ICD-10-CM | POA: Diagnosis not present

## 2019-10-19 DIAGNOSIS — H409 Unspecified glaucoma: Secondary | ICD-10-CM | POA: Diagnosis not present

## 2019-10-19 DIAGNOSIS — C029 Malignant neoplasm of tongue, unspecified: Secondary | ICD-10-CM | POA: Diagnosis not present

## 2019-10-20 DIAGNOSIS — B351 Tinea unguium: Secondary | ICD-10-CM | POA: Diagnosis not present

## 2019-10-23 DIAGNOSIS — H409 Unspecified glaucoma: Secondary | ICD-10-CM | POA: Diagnosis not present

## 2019-10-23 DIAGNOSIS — R262 Difficulty in walking, not elsewhere classified: Secondary | ICD-10-CM | POA: Diagnosis not present

## 2019-10-23 DIAGNOSIS — M6281 Muscle weakness (generalized): Secondary | ICD-10-CM | POA: Diagnosis not present

## 2019-10-23 DIAGNOSIS — I129 Hypertensive chronic kidney disease with stage 1 through stage 4 chronic kidney disease, or unspecified chronic kidney disease: Secondary | ICD-10-CM | POA: Diagnosis not present

## 2019-10-23 DIAGNOSIS — N183 Chronic kidney disease, stage 3 unspecified: Secondary | ICD-10-CM | POA: Diagnosis not present

## 2019-10-23 DIAGNOSIS — C029 Malignant neoplasm of tongue, unspecified: Secondary | ICD-10-CM | POA: Diagnosis not present

## 2019-10-23 DIAGNOSIS — R011 Cardiac murmur, unspecified: Secondary | ICD-10-CM | POA: Diagnosis not present

## 2019-10-24 DIAGNOSIS — M6281 Muscle weakness (generalized): Secondary | ICD-10-CM | POA: Diagnosis not present

## 2019-10-24 DIAGNOSIS — C029 Malignant neoplasm of tongue, unspecified: Secondary | ICD-10-CM | POA: Diagnosis not present

## 2019-10-24 DIAGNOSIS — I129 Hypertensive chronic kidney disease with stage 1 through stage 4 chronic kidney disease, or unspecified chronic kidney disease: Secondary | ICD-10-CM | POA: Diagnosis not present

## 2019-10-24 DIAGNOSIS — R262 Difficulty in walking, not elsewhere classified: Secondary | ICD-10-CM | POA: Diagnosis not present

## 2019-10-24 DIAGNOSIS — N183 Chronic kidney disease, stage 3 unspecified: Secondary | ICD-10-CM | POA: Diagnosis not present

## 2019-10-24 DIAGNOSIS — R011 Cardiac murmur, unspecified: Secondary | ICD-10-CM | POA: Diagnosis not present

## 2019-10-24 DIAGNOSIS — H409 Unspecified glaucoma: Secondary | ICD-10-CM | POA: Diagnosis not present

## 2019-10-30 DIAGNOSIS — L2081 Atopic neurodermatitis: Secondary | ICD-10-CM | POA: Diagnosis not present

## 2019-11-01 DIAGNOSIS — C021 Malignant neoplasm of border of tongue: Secondary | ICD-10-CM | POA: Diagnosis not present

## 2019-11-01 DIAGNOSIS — R5383 Other fatigue: Secondary | ICD-10-CM | POA: Diagnosis not present

## 2019-11-01 DIAGNOSIS — T451X5A Adverse effect of antineoplastic and immunosuppressive drugs, initial encounter: Secondary | ICD-10-CM | POA: Diagnosis not present

## 2019-11-01 DIAGNOSIS — D701 Agranulocytosis secondary to cancer chemotherapy: Secondary | ICD-10-CM | POA: Diagnosis not present

## 2019-11-01 DIAGNOSIS — Z79899 Other long term (current) drug therapy: Secondary | ICD-10-CM | POA: Diagnosis not present

## 2019-11-03 DIAGNOSIS — H04123 Dry eye syndrome of bilateral lacrimal glands: Secondary | ICD-10-CM | POA: Diagnosis not present

## 2019-11-08 DIAGNOSIS — H539 Unspecified visual disturbance: Secondary | ICD-10-CM | POA: Diagnosis not present

## 2019-11-08 DIAGNOSIS — N189 Chronic kidney disease, unspecified: Secondary | ICD-10-CM | POA: Diagnosis not present

## 2019-11-08 DIAGNOSIS — G893 Neoplasm related pain (acute) (chronic): Secondary | ICD-10-CM | POA: Diagnosis not present

## 2019-11-08 DIAGNOSIS — E079 Disorder of thyroid, unspecified: Secondary | ICD-10-CM | POA: Diagnosis not present

## 2019-11-08 DIAGNOSIS — I129 Hypertensive chronic kidney disease with stage 1 through stage 4 chronic kidney disease, or unspecified chronic kidney disease: Secondary | ICD-10-CM | POA: Diagnosis not present

## 2019-11-08 DIAGNOSIS — R197 Diarrhea, unspecified: Secondary | ICD-10-CM | POA: Diagnosis not present

## 2019-11-08 DIAGNOSIS — C021 Malignant neoplasm of border of tongue: Secondary | ICD-10-CM | POA: Diagnosis not present

## 2019-11-08 DIAGNOSIS — H9201 Otalgia, right ear: Secondary | ICD-10-CM | POA: Diagnosis not present

## 2019-11-08 DIAGNOSIS — C029 Malignant neoplasm of tongue, unspecified: Secondary | ICD-10-CM | POA: Diagnosis not present

## 2019-11-08 DIAGNOSIS — R5383 Other fatigue: Secondary | ICD-10-CM | POA: Diagnosis not present

## 2019-11-14 DIAGNOSIS — R21 Rash and other nonspecific skin eruption: Secondary | ICD-10-CM | POA: Diagnosis not present

## 2019-11-17 DIAGNOSIS — I10 Essential (primary) hypertension: Secondary | ICD-10-CM | POA: Diagnosis not present

## 2019-11-17 DIAGNOSIS — C7800 Secondary malignant neoplasm of unspecified lung: Secondary | ICD-10-CM

## 2019-11-17 DIAGNOSIS — E43 Unspecified severe protein-calorie malnutrition: Secondary | ICD-10-CM | POA: Diagnosis not present

## 2019-11-17 DIAGNOSIS — E039 Hypothyroidism, unspecified: Secondary | ICD-10-CM | POA: Diagnosis not present

## 2019-11-17 DIAGNOSIS — F39 Unspecified mood [affective] disorder: Secondary | ICD-10-CM | POA: Diagnosis not present

## 2019-11-17 DIAGNOSIS — C029 Malignant neoplasm of tongue, unspecified: Secondary | ICD-10-CM | POA: Diagnosis not present

## 2019-11-29 DIAGNOSIS — G893 Neoplasm related pain (acute) (chronic): Secondary | ICD-10-CM | POA: Diagnosis not present

## 2019-11-29 DIAGNOSIS — N189 Chronic kidney disease, unspecified: Secondary | ICD-10-CM | POA: Diagnosis not present

## 2019-11-29 DIAGNOSIS — Z79899 Other long term (current) drug therapy: Secondary | ICD-10-CM | POA: Diagnosis not present

## 2019-11-29 DIAGNOSIS — R5383 Other fatigue: Secondary | ICD-10-CM | POA: Diagnosis not present

## 2019-11-29 DIAGNOSIS — R918 Other nonspecific abnormal finding of lung field: Secondary | ICD-10-CM | POA: Diagnosis not present

## 2019-11-29 DIAGNOSIS — L299 Pruritus, unspecified: Secondary | ICD-10-CM | POA: Diagnosis not present

## 2019-11-29 DIAGNOSIS — C021 Malignant neoplasm of border of tongue: Secondary | ICD-10-CM | POA: Diagnosis not present

## 2019-11-29 DIAGNOSIS — I129 Hypertensive chronic kidney disease with stage 1 through stage 4 chronic kidney disease, or unspecified chronic kidney disease: Secondary | ICD-10-CM | POA: Diagnosis not present

## 2019-11-29 DIAGNOSIS — Z7982 Long term (current) use of aspirin: Secondary | ICD-10-CM | POA: Diagnosis not present

## 2019-11-29 DIAGNOSIS — E079 Disorder of thyroid, unspecified: Secondary | ICD-10-CM | POA: Diagnosis not present

## 2019-11-29 DIAGNOSIS — R599 Enlarged lymph nodes, unspecified: Secondary | ICD-10-CM | POA: Diagnosis not present

## 2019-12-06 DIAGNOSIS — C021 Malignant neoplasm of border of tongue: Secondary | ICD-10-CM | POA: Diagnosis not present

## 2019-12-06 DIAGNOSIS — R21 Rash and other nonspecific skin eruption: Secondary | ICD-10-CM | POA: Diagnosis not present

## 2019-12-06 DIAGNOSIS — D72829 Elevated white blood cell count, unspecified: Secondary | ICD-10-CM | POA: Diagnosis not present

## 2019-12-06 DIAGNOSIS — Z8551 Personal history of malignant neoplasm of bladder: Secondary | ICD-10-CM | POA: Diagnosis not present

## 2019-12-06 DIAGNOSIS — E039 Hypothyroidism, unspecified: Secondary | ICD-10-CM | POA: Diagnosis not present

## 2019-12-06 DIAGNOSIS — G893 Neoplasm related pain (acute) (chronic): Secondary | ICD-10-CM | POA: Diagnosis not present

## 2019-12-06 DIAGNOSIS — L270 Generalized skin eruption due to drugs and medicaments taken internally: Secondary | ICD-10-CM | POA: Diagnosis not present

## 2019-12-06 DIAGNOSIS — N189 Chronic kidney disease, unspecified: Secondary | ICD-10-CM | POA: Diagnosis not present

## 2019-12-06 DIAGNOSIS — T451X5A Adverse effect of antineoplastic and immunosuppressive drugs, initial encounter: Secondary | ICD-10-CM | POA: Diagnosis not present

## 2019-12-06 DIAGNOSIS — R918 Other nonspecific abnormal finding of lung field: Secondary | ICD-10-CM | POA: Diagnosis not present

## 2019-12-11 IMAGING — CT CT CERVICAL SPINE W/O CM
4 of 7 series · 13 of 33 positions shown, 14 images · non-contrast
Comparison: Brain MRI 10/31/2012

CLINICAL DATA: Fall.  Stepped off of a curb and head hit pavement.

EXAM:
CT HEAD WITHOUT CONTRAST
CT CERVICAL SPINE WITHOUT CONTRAST
TECHNIQUE: Multidetector CT imaging of the head and cervical spine was
performed following the standard protocol without intravenous
contrast. Multiplanar CT image reconstructions of the cervical spine
were also generated.

[Series 7: c spine soft · axial · 0.29mm/px · z∈[+148,+246]mm · 4 of 83 slices shown]
[im 17/83  soft-tissue]
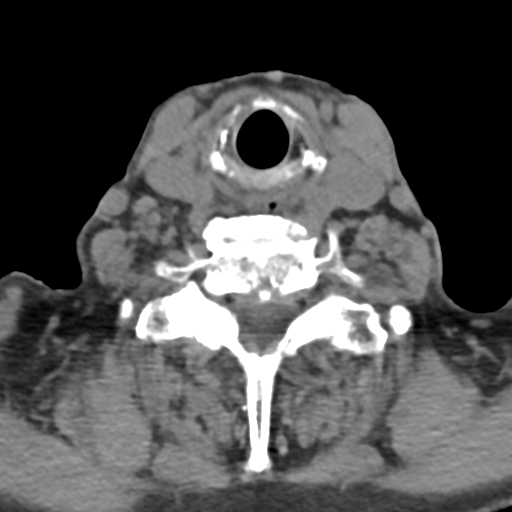
[im 33/83  soft-tissue]
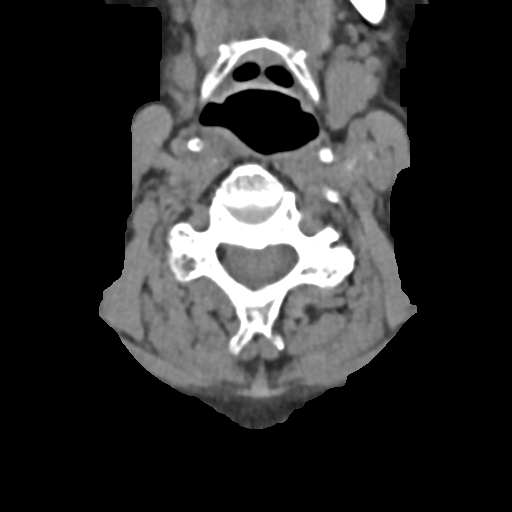
[im 50/83  soft-tissue]
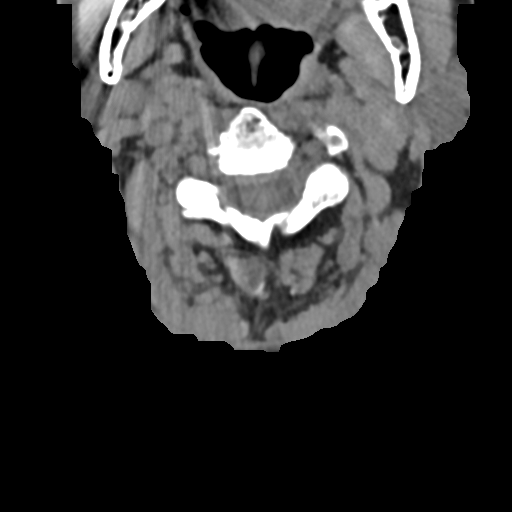
[im 66/83  soft-tissue]
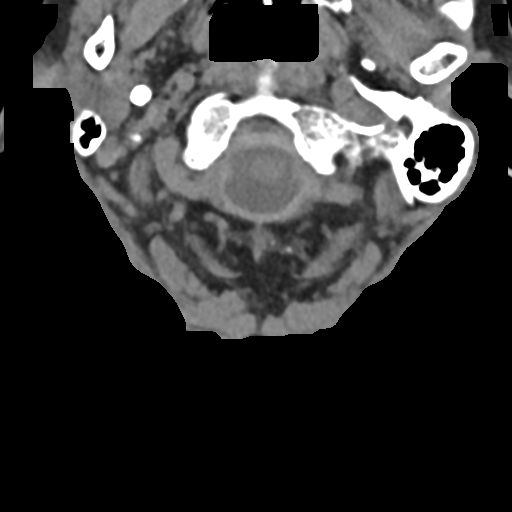

[Series 8: sagittal bone · sagittal · 0.22mm/px · 4 of 45 slices shown]
[im 9/45  bone]
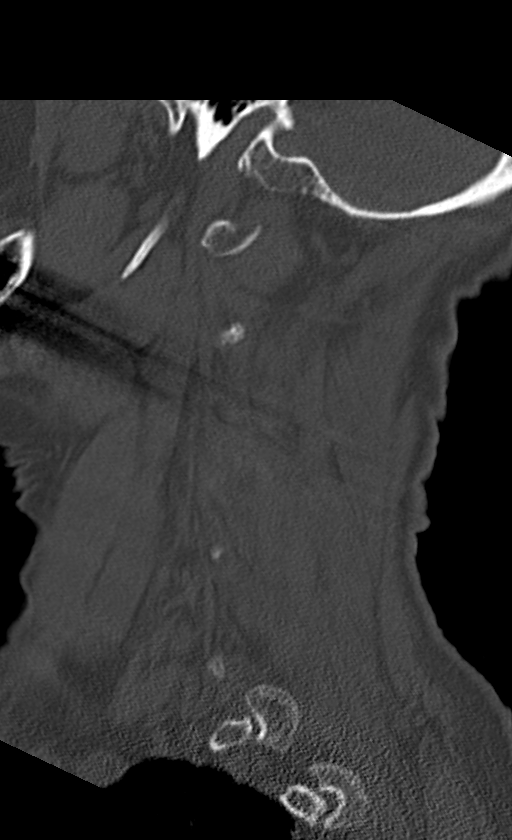
[im 18/45  bone]
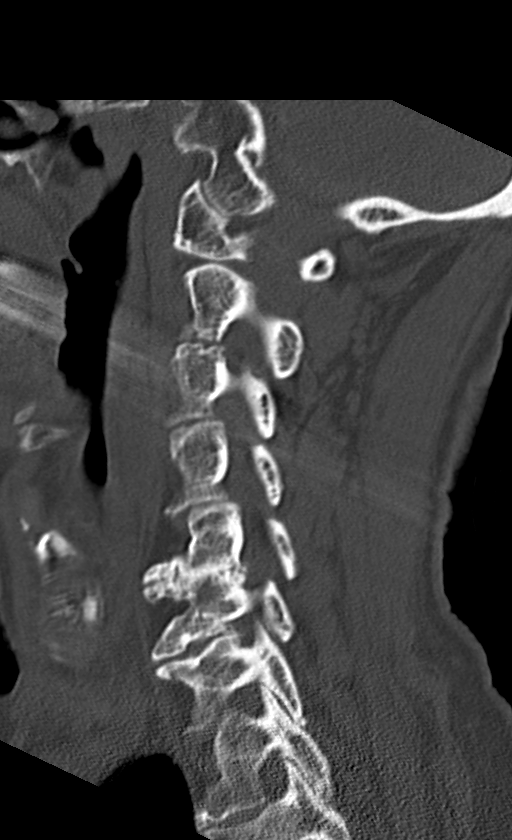
[im 27/45  bone]
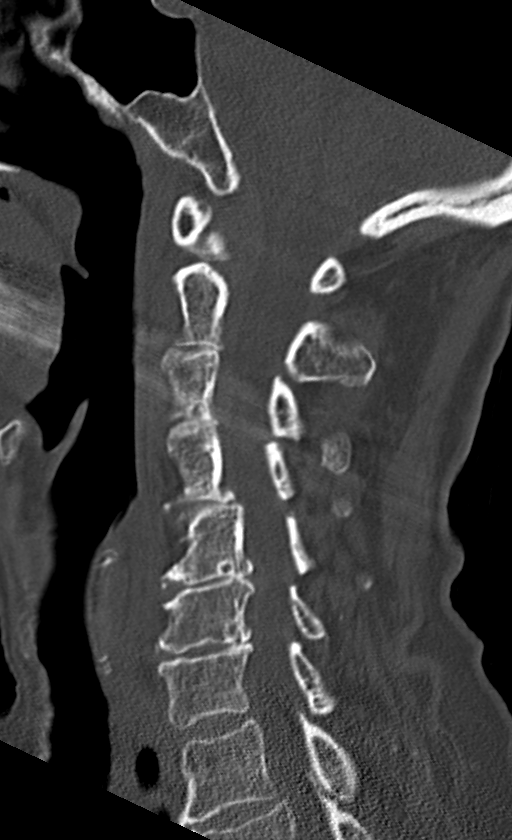
[im 36/45  bone]
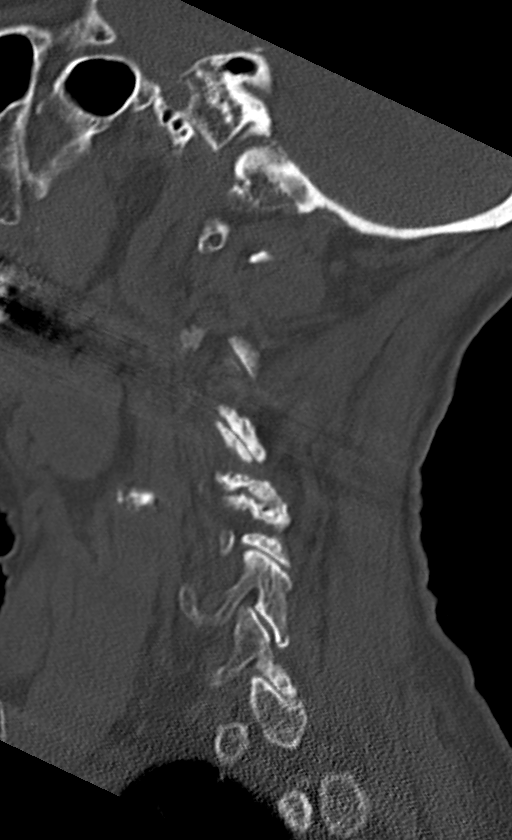

[Series 9: coronal bone · coronal · 0.23mm/px · 1 of 50 slices shown]
[im 25/50  bone]
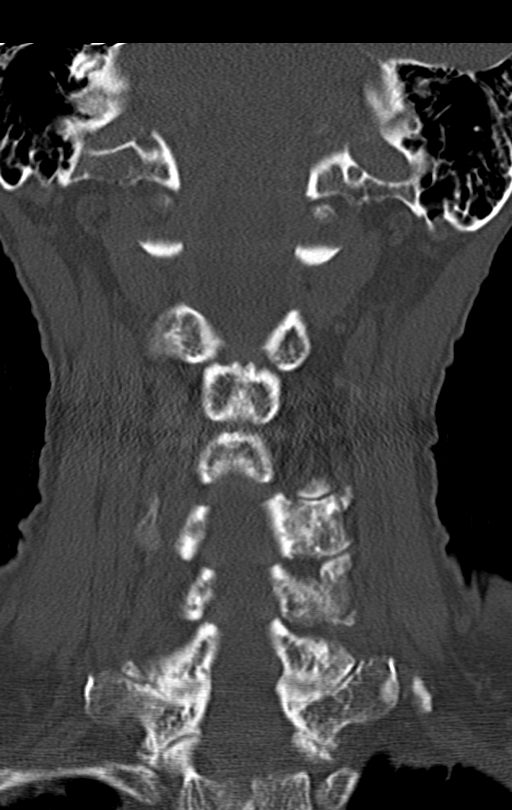

[Series 10: orthogonal bone · axial · 0.23mm/px · z∈[+132,+221]mm · 4 of 82 slices shown, 5 images]
[im 17/82  soft-tissue]
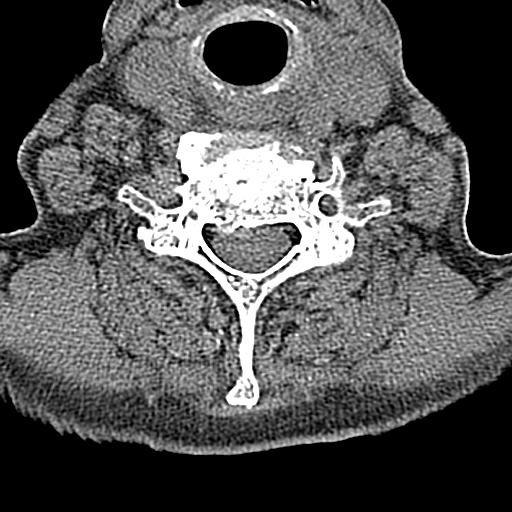
[im 17/82  bone]
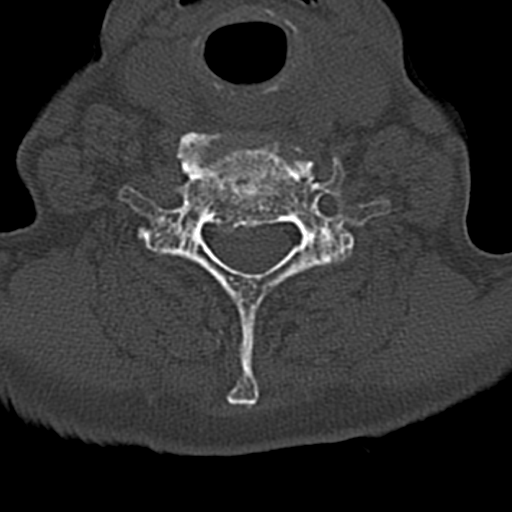
[im 33/82  bone]
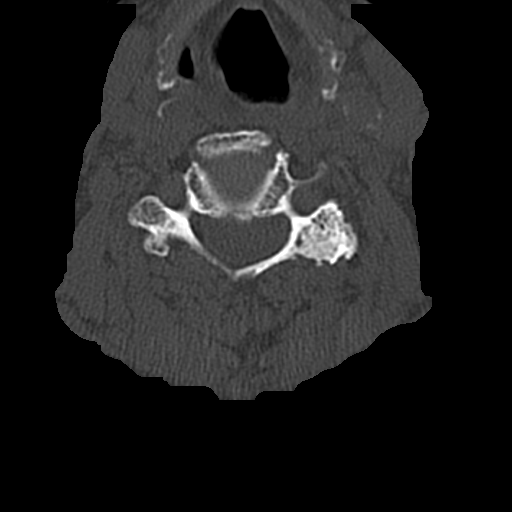
[im 49/82  bone]
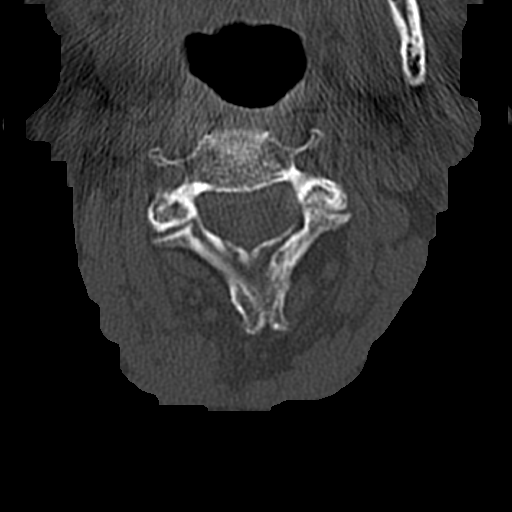
[im 65/82  bone]
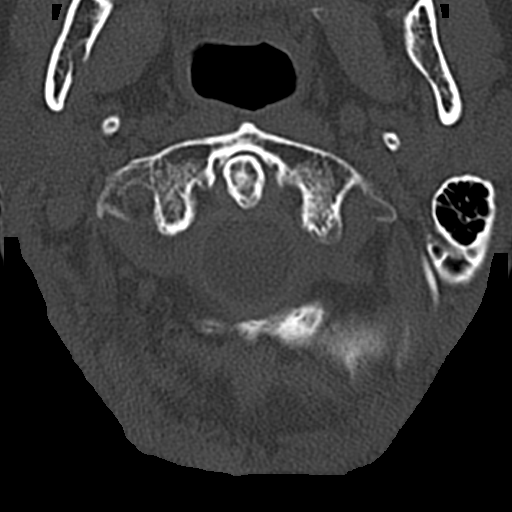

[13 of 33 positions shown; findings below may reference images not displayed]

FINDINGS: CT HEAD FINDINGS

Brain: There is no evidence for acute hemorrhage, hydrocephalus,
mass lesion, or abnormal extra-axial fluid collection. No definite
CT evidence for acute infarction. Age-appropriate atrophy. Patchy
low attenuation in the deep hemispheric and periventricular white
matter is nonspecific, but likely reflects chronic microvascular
ischemic demyelination.

Vascular: No hyperdense vessel or unexpected calcification.

Skull: No evidence for fracture. No worrisome lytic or sclerotic
lesion.

Sinuses/Orbits: The visualized paranasal sinuses and mastoid air
cells are clear. Visualized portions of the globes and intraorbital
fat are unremarkable.

Other: None.

CT CERVICAL SPINE FINDINGS

Alignment: Straightening of normal cervical lordosis. Mild
anterolisthesis of of C4 on 5 is compatible with the advanced
left-sided facet osteoarthritis.

Skull base and vertebrae: No acute fracture. No primary bone lesion
or focal pathologic process.

Soft tissues and spinal canal: No prevertebral fluid or swelling. No
visible canal hematoma.

Disc levels:  Loss of disc height is noted at C5-6 and C6-7.

Upper chest: Unremarkable.

Other: None.
IMPRESSION: 1. No acute intracranial abnormality. Mild white matter chronic
ischemic change.
2. Degenerative changes in the cervical spine without fracture.
3. Loss of cervical lordosis. This can be related to patient
positioning, muscle spasm or soft tissue injury.

## 2019-12-13 DIAGNOSIS — D72829 Elevated white blood cell count, unspecified: Secondary | ICD-10-CM | POA: Diagnosis not present

## 2019-12-13 DIAGNOSIS — C028 Malignant neoplasm of overlapping sites of tongue: Secondary | ICD-10-CM | POA: Diagnosis not present

## 2019-12-13 DIAGNOSIS — T451X5A Adverse effect of antineoplastic and immunosuppressive drugs, initial encounter: Secondary | ICD-10-CM | POA: Diagnosis not present

## 2019-12-13 DIAGNOSIS — E039 Hypothyroidism, unspecified: Secondary | ICD-10-CM | POA: Diagnosis not present

## 2019-12-13 DIAGNOSIS — L270 Generalized skin eruption due to drugs and medicaments taken internally: Secondary | ICD-10-CM | POA: Diagnosis not present

## 2019-12-13 DIAGNOSIS — Z8551 Personal history of malignant neoplasm of bladder: Secondary | ICD-10-CM | POA: Diagnosis not present

## 2019-12-14 DIAGNOSIS — C029 Malignant neoplasm of tongue, unspecified: Secondary | ICD-10-CM | POA: Diagnosis not present

## 2019-12-18 DIAGNOSIS — K111 Hypertrophy of salivary gland: Secondary | ICD-10-CM | POA: Diagnosis not present

## 2019-12-18 DIAGNOSIS — R21 Rash and other nonspecific skin eruption: Secondary | ICD-10-CM | POA: Diagnosis not present

## 2019-12-20 DIAGNOSIS — B351 Tinea unguium: Secondary | ICD-10-CM | POA: Diagnosis not present

## 2019-12-20 DIAGNOSIS — C021 Malignant neoplasm of border of tongue: Secondary | ICD-10-CM | POA: Diagnosis not present

## 2019-12-22 DIAGNOSIS — B351 Tinea unguium: Secondary | ICD-10-CM | POA: Diagnosis not present

## 2019-12-27 DIAGNOSIS — E039 Hypothyroidism, unspecified: Secondary | ICD-10-CM | POA: Diagnosis not present

## 2019-12-27 DIAGNOSIS — Z79899 Other long term (current) drug therapy: Secondary | ICD-10-CM | POA: Diagnosis not present

## 2019-12-27 DIAGNOSIS — N189 Chronic kidney disease, unspecified: Secondary | ICD-10-CM | POA: Diagnosis not present

## 2019-12-27 DIAGNOSIS — R2681 Unsteadiness on feet: Secondary | ICD-10-CM | POA: Diagnosis not present

## 2019-12-27 DIAGNOSIS — R221 Localized swelling, mass and lump, neck: Secondary | ICD-10-CM | POA: Diagnosis not present

## 2019-12-27 DIAGNOSIS — G893 Neoplasm related pain (acute) (chronic): Secondary | ICD-10-CM | POA: Diagnosis not present

## 2019-12-27 DIAGNOSIS — N183 Chronic kidney disease, stage 3 unspecified: Secondary | ICD-10-CM | POA: Diagnosis not present

## 2019-12-27 DIAGNOSIS — R262 Difficulty in walking, not elsewhere classified: Secondary | ICD-10-CM | POA: Diagnosis not present

## 2019-12-27 DIAGNOSIS — Z5111 Encounter for antineoplastic chemotherapy: Secondary | ICD-10-CM | POA: Diagnosis not present

## 2019-12-27 DIAGNOSIS — C029 Malignant neoplasm of tongue, unspecified: Secondary | ICD-10-CM | POA: Diagnosis not present

## 2019-12-27 DIAGNOSIS — H409 Unspecified glaucoma: Secondary | ICD-10-CM | POA: Diagnosis not present

## 2019-12-27 DIAGNOSIS — C021 Malignant neoplasm of border of tongue: Secondary | ICD-10-CM | POA: Diagnosis not present

## 2019-12-27 DIAGNOSIS — R197 Diarrhea, unspecified: Secondary | ICD-10-CM | POA: Diagnosis not present

## 2019-12-27 DIAGNOSIS — I129 Hypertensive chronic kidney disease with stage 1 through stage 4 chronic kidney disease, or unspecified chronic kidney disease: Secondary | ICD-10-CM | POA: Diagnosis not present

## 2019-12-30 DIAGNOSIS — N390 Urinary tract infection, site not specified: Secondary | ICD-10-CM | POA: Diagnosis not present

## 2020-01-01 DIAGNOSIS — R262 Difficulty in walking, not elsewhere classified: Secondary | ICD-10-CM | POA: Diagnosis not present

## 2020-01-01 DIAGNOSIS — I129 Hypertensive chronic kidney disease with stage 1 through stage 4 chronic kidney disease, or unspecified chronic kidney disease: Secondary | ICD-10-CM | POA: Diagnosis not present

## 2020-01-01 DIAGNOSIS — N183 Chronic kidney disease, stage 3 unspecified: Secondary | ICD-10-CM | POA: Diagnosis not present

## 2020-01-01 DIAGNOSIS — R2681 Unsteadiness on feet: Secondary | ICD-10-CM | POA: Diagnosis not present

## 2020-01-01 DIAGNOSIS — I10 Essential (primary) hypertension: Secondary | ICD-10-CM | POA: Diagnosis not present

## 2020-01-01 DIAGNOSIS — C3490 Malignant neoplasm of unspecified part of unspecified bronchus or lung: Secondary | ICD-10-CM | POA: Diagnosis not present

## 2020-01-01 DIAGNOSIS — H409 Unspecified glaucoma: Secondary | ICD-10-CM | POA: Diagnosis not present

## 2020-01-01 DIAGNOSIS — C029 Malignant neoplasm of tongue, unspecified: Secondary | ICD-10-CM | POA: Diagnosis not present

## 2020-01-01 DIAGNOSIS — E441 Mild protein-calorie malnutrition: Secondary | ICD-10-CM | POA: Diagnosis not present

## 2020-01-01 DIAGNOSIS — D0007 Carcinoma in situ of tongue: Secondary | ICD-10-CM | POA: Diagnosis not present

## 2020-01-02 DIAGNOSIS — H409 Unspecified glaucoma: Secondary | ICD-10-CM | POA: Diagnosis not present

## 2020-01-02 DIAGNOSIS — C029 Malignant neoplasm of tongue, unspecified: Secondary | ICD-10-CM | POA: Diagnosis not present

## 2020-01-02 DIAGNOSIS — I129 Hypertensive chronic kidney disease with stage 1 through stage 4 chronic kidney disease, or unspecified chronic kidney disease: Secondary | ICD-10-CM | POA: Diagnosis not present

## 2020-01-02 DIAGNOSIS — R2681 Unsteadiness on feet: Secondary | ICD-10-CM | POA: Diagnosis not present

## 2020-01-02 DIAGNOSIS — N183 Chronic kidney disease, stage 3 unspecified: Secondary | ICD-10-CM | POA: Diagnosis not present

## 2020-01-02 DIAGNOSIS — R262 Difficulty in walking, not elsewhere classified: Secondary | ICD-10-CM | POA: Diagnosis not present

## 2020-01-04 DIAGNOSIS — C029 Malignant neoplasm of tongue, unspecified: Secondary | ICD-10-CM | POA: Diagnosis not present

## 2020-01-04 DIAGNOSIS — N183 Chronic kidney disease, stage 3 unspecified: Secondary | ICD-10-CM | POA: Diagnosis not present

## 2020-01-04 DIAGNOSIS — R011 Cardiac murmur, unspecified: Secondary | ICD-10-CM | POA: Diagnosis not present

## 2020-01-04 DIAGNOSIS — I129 Hypertensive chronic kidney disease with stage 1 through stage 4 chronic kidney disease, or unspecified chronic kidney disease: Secondary | ICD-10-CM | POA: Diagnosis not present

## 2020-01-04 DIAGNOSIS — R262 Difficulty in walking, not elsewhere classified: Secondary | ICD-10-CM | POA: Diagnosis not present

## 2020-01-04 DIAGNOSIS — H409 Unspecified glaucoma: Secondary | ICD-10-CM | POA: Diagnosis not present

## 2020-01-04 DIAGNOSIS — M6281 Muscle weakness (generalized): Secondary | ICD-10-CM | POA: Diagnosis not present

## 2020-01-05 DIAGNOSIS — Z8042 Family history of malignant neoplasm of prostate: Secondary | ICD-10-CM | POA: Diagnosis not present

## 2020-01-05 DIAGNOSIS — Z87891 Personal history of nicotine dependence: Secondary | ICD-10-CM | POA: Diagnosis not present

## 2020-01-05 DIAGNOSIS — Z801 Family history of malignant neoplasm of trachea, bronchus and lung: Secondary | ICD-10-CM | POA: Diagnosis not present

## 2020-01-05 DIAGNOSIS — I509 Heart failure, unspecified: Secondary | ICD-10-CM | POA: Diagnosis not present

## 2020-01-05 DIAGNOSIS — C021 Malignant neoplasm of border of tongue: Secondary | ICD-10-CM | POA: Diagnosis not present

## 2020-01-05 DIAGNOSIS — Z9221 Personal history of antineoplastic chemotherapy: Secondary | ICD-10-CM | POA: Diagnosis not present

## 2020-01-05 DIAGNOSIS — Z800 Family history of malignant neoplasm of digestive organs: Secondary | ICD-10-CM | POA: Diagnosis not present

## 2020-01-05 DIAGNOSIS — Z8551 Personal history of malignant neoplasm of bladder: Secondary | ICD-10-CM | POA: Diagnosis not present

## 2020-01-05 DIAGNOSIS — N189 Chronic kidney disease, unspecified: Secondary | ICD-10-CM | POA: Diagnosis not present

## 2020-01-08 DIAGNOSIS — R011 Cardiac murmur, unspecified: Secondary | ICD-10-CM | POA: Diagnosis not present

## 2020-01-08 DIAGNOSIS — C029 Malignant neoplasm of tongue, unspecified: Secondary | ICD-10-CM | POA: Diagnosis not present

## 2020-01-08 DIAGNOSIS — N183 Chronic kidney disease, stage 3 unspecified: Secondary | ICD-10-CM | POA: Diagnosis not present

## 2020-01-08 DIAGNOSIS — R262 Difficulty in walking, not elsewhere classified: Secondary | ICD-10-CM | POA: Diagnosis not present

## 2020-01-08 DIAGNOSIS — H409 Unspecified glaucoma: Secondary | ICD-10-CM | POA: Diagnosis not present

## 2020-01-08 DIAGNOSIS — M6281 Muscle weakness (generalized): Secondary | ICD-10-CM | POA: Diagnosis not present

## 2020-01-08 DIAGNOSIS — I129 Hypertensive chronic kidney disease with stage 1 through stage 4 chronic kidney disease, or unspecified chronic kidney disease: Secondary | ICD-10-CM | POA: Diagnosis not present

## 2020-01-10 DIAGNOSIS — Z79899 Other long term (current) drug therapy: Secondary | ICD-10-CM | POA: Diagnosis not present

## 2020-01-10 DIAGNOSIS — C021 Malignant neoplasm of border of tongue: Secondary | ICD-10-CM | POA: Diagnosis not present

## 2020-01-10 DIAGNOSIS — Z5181 Encounter for therapeutic drug level monitoring: Secondary | ICD-10-CM | POA: Diagnosis not present

## 2020-01-11 DIAGNOSIS — I129 Hypertensive chronic kidney disease with stage 1 through stage 4 chronic kidney disease, or unspecified chronic kidney disease: Secondary | ICD-10-CM | POA: Diagnosis not present

## 2020-01-11 DIAGNOSIS — C029 Malignant neoplasm of tongue, unspecified: Secondary | ICD-10-CM | POA: Diagnosis not present

## 2020-01-11 DIAGNOSIS — M6281 Muscle weakness (generalized): Secondary | ICD-10-CM | POA: Diagnosis not present

## 2020-01-11 DIAGNOSIS — R262 Difficulty in walking, not elsewhere classified: Secondary | ICD-10-CM | POA: Diagnosis not present

## 2020-01-11 DIAGNOSIS — N183 Chronic kidney disease, stage 3 unspecified: Secondary | ICD-10-CM | POA: Diagnosis not present

## 2020-01-11 DIAGNOSIS — H409 Unspecified glaucoma: Secondary | ICD-10-CM | POA: Diagnosis not present

## 2020-01-11 DIAGNOSIS — R011 Cardiac murmur, unspecified: Secondary | ICD-10-CM | POA: Diagnosis not present

## 2020-01-17 DIAGNOSIS — I509 Heart failure, unspecified: Secondary | ICD-10-CM | POA: Diagnosis not present

## 2020-01-17 DIAGNOSIS — Z8042 Family history of malignant neoplasm of prostate: Secondary | ICD-10-CM | POA: Diagnosis not present

## 2020-01-17 DIAGNOSIS — Z801 Family history of malignant neoplasm of trachea, bronchus and lung: Secondary | ICD-10-CM | POA: Diagnosis not present

## 2020-01-17 DIAGNOSIS — Z9221 Personal history of antineoplastic chemotherapy: Secondary | ICD-10-CM | POA: Diagnosis not present

## 2020-01-17 DIAGNOSIS — C021 Malignant neoplasm of border of tongue: Secondary | ICD-10-CM | POA: Diagnosis not present

## 2020-01-17 DIAGNOSIS — Z8551 Personal history of malignant neoplasm of bladder: Secondary | ICD-10-CM | POA: Diagnosis not present

## 2020-01-17 DIAGNOSIS — N189 Chronic kidney disease, unspecified: Secondary | ICD-10-CM | POA: Diagnosis not present

## 2020-01-17 DIAGNOSIS — Z87891 Personal history of nicotine dependence: Secondary | ICD-10-CM | POA: Diagnosis not present

## 2020-01-17 DIAGNOSIS — C028 Malignant neoplasm of overlapping sites of tongue: Secondary | ICD-10-CM | POA: Diagnosis not present

## 2020-01-17 DIAGNOSIS — Z800 Family history of malignant neoplasm of digestive organs: Secondary | ICD-10-CM | POA: Diagnosis not present

## 2020-01-18 DIAGNOSIS — N183 Chronic kidney disease, stage 3 unspecified: Secondary | ICD-10-CM | POA: Diagnosis not present

## 2020-01-18 DIAGNOSIS — M6281 Muscle weakness (generalized): Secondary | ICD-10-CM | POA: Diagnosis not present

## 2020-01-18 DIAGNOSIS — R011 Cardiac murmur, unspecified: Secondary | ICD-10-CM | POA: Diagnosis not present

## 2020-01-18 DIAGNOSIS — R262 Difficulty in walking, not elsewhere classified: Secondary | ICD-10-CM | POA: Diagnosis not present

## 2020-01-18 DIAGNOSIS — I129 Hypertensive chronic kidney disease with stage 1 through stage 4 chronic kidney disease, or unspecified chronic kidney disease: Secondary | ICD-10-CM | POA: Diagnosis not present

## 2020-01-18 DIAGNOSIS — C029 Malignant neoplasm of tongue, unspecified: Secondary | ICD-10-CM | POA: Diagnosis not present

## 2020-01-18 DIAGNOSIS — H409 Unspecified glaucoma: Secondary | ICD-10-CM | POA: Diagnosis not present

## 2020-01-22 DIAGNOSIS — M6281 Muscle weakness (generalized): Secondary | ICD-10-CM | POA: Diagnosis not present

## 2020-01-22 DIAGNOSIS — R262 Difficulty in walking, not elsewhere classified: Secondary | ICD-10-CM | POA: Diagnosis not present

## 2020-01-22 DIAGNOSIS — N183 Chronic kidney disease, stage 3 unspecified: Secondary | ICD-10-CM | POA: Diagnosis not present

## 2020-01-22 DIAGNOSIS — C029 Malignant neoplasm of tongue, unspecified: Secondary | ICD-10-CM | POA: Diagnosis not present

## 2020-01-22 DIAGNOSIS — R011 Cardiac murmur, unspecified: Secondary | ICD-10-CM | POA: Diagnosis not present

## 2020-01-22 DIAGNOSIS — H409 Unspecified glaucoma: Secondary | ICD-10-CM | POA: Diagnosis not present

## 2020-01-22 DIAGNOSIS — I129 Hypertensive chronic kidney disease with stage 1 through stage 4 chronic kidney disease, or unspecified chronic kidney disease: Secondary | ICD-10-CM | POA: Diagnosis not present

## 2020-01-23 DIAGNOSIS — C029 Malignant neoplasm of tongue, unspecified: Secondary | ICD-10-CM | POA: Diagnosis not present

## 2020-01-23 DIAGNOSIS — M6281 Muscle weakness (generalized): Secondary | ICD-10-CM | POA: Diagnosis not present

## 2020-01-23 DIAGNOSIS — N183 Chronic kidney disease, stage 3 unspecified: Secondary | ICD-10-CM | POA: Diagnosis not present

## 2020-01-23 DIAGNOSIS — R011 Cardiac murmur, unspecified: Secondary | ICD-10-CM | POA: Diagnosis not present

## 2020-01-23 DIAGNOSIS — H409 Unspecified glaucoma: Secondary | ICD-10-CM | POA: Diagnosis not present

## 2020-01-23 DIAGNOSIS — I129 Hypertensive chronic kidney disease with stage 1 through stage 4 chronic kidney disease, or unspecified chronic kidney disease: Secondary | ICD-10-CM | POA: Diagnosis not present

## 2020-01-23 DIAGNOSIS — R262 Difficulty in walking, not elsewhere classified: Secondary | ICD-10-CM | POA: Diagnosis not present

## 2020-01-24 DIAGNOSIS — Z79899 Other long term (current) drug therapy: Secondary | ICD-10-CM | POA: Diagnosis not present

## 2020-01-24 DIAGNOSIS — Z5111 Encounter for antineoplastic chemotherapy: Secondary | ICD-10-CM | POA: Diagnosis not present

## 2020-01-24 DIAGNOSIS — C021 Malignant neoplasm of border of tongue: Secondary | ICD-10-CM | POA: Diagnosis not present

## 2020-01-26 DIAGNOSIS — C028 Malignant neoplasm of overlapping sites of tongue: Secondary | ICD-10-CM | POA: Diagnosis not present

## 2020-01-26 DIAGNOSIS — R220 Localized swelling, mass and lump, head: Secondary | ICD-10-CM | POA: Diagnosis not present

## 2020-01-26 DIAGNOSIS — Z510 Encounter for antineoplastic radiation therapy: Secondary | ICD-10-CM | POA: Diagnosis not present

## 2020-01-29 DIAGNOSIS — C028 Malignant neoplasm of overlapping sites of tongue: Secondary | ICD-10-CM | POA: Diagnosis not present

## 2020-01-29 DIAGNOSIS — R220 Localized swelling, mass and lump, head: Secondary | ICD-10-CM | POA: Diagnosis not present

## 2020-01-29 DIAGNOSIS — Z510 Encounter for antineoplastic radiation therapy: Secondary | ICD-10-CM | POA: Diagnosis not present

## 2020-01-30 DIAGNOSIS — Z510 Encounter for antineoplastic radiation therapy: Secondary | ICD-10-CM | POA: Diagnosis not present

## 2020-01-30 DIAGNOSIS — C028 Malignant neoplasm of overlapping sites of tongue: Secondary | ICD-10-CM | POA: Diagnosis not present

## 2020-01-30 DIAGNOSIS — R220 Localized swelling, mass and lump, head: Secondary | ICD-10-CM | POA: Diagnosis not present

## 2020-01-31 DIAGNOSIS — C028 Malignant neoplasm of overlapping sites of tongue: Secondary | ICD-10-CM | POA: Diagnosis not present

## 2020-01-31 DIAGNOSIS — R220 Localized swelling, mass and lump, head: Secondary | ICD-10-CM | POA: Diagnosis not present

## 2020-01-31 DIAGNOSIS — Z510 Encounter for antineoplastic radiation therapy: Secondary | ICD-10-CM | POA: Diagnosis not present

## 2020-02-01 DIAGNOSIS — Z510 Encounter for antineoplastic radiation therapy: Secondary | ICD-10-CM | POA: Diagnosis not present

## 2020-02-01 DIAGNOSIS — R220 Localized swelling, mass and lump, head: Secondary | ICD-10-CM | POA: Diagnosis not present

## 2020-02-01 DIAGNOSIS — H409 Unspecified glaucoma: Secondary | ICD-10-CM | POA: Diagnosis not present

## 2020-02-01 DIAGNOSIS — R262 Difficulty in walking, not elsewhere classified: Secondary | ICD-10-CM | POA: Diagnosis not present

## 2020-02-01 DIAGNOSIS — C028 Malignant neoplasm of overlapping sites of tongue: Secondary | ICD-10-CM | POA: Diagnosis not present

## 2020-02-01 DIAGNOSIS — I129 Hypertensive chronic kidney disease with stage 1 through stage 4 chronic kidney disease, or unspecified chronic kidney disease: Secondary | ICD-10-CM | POA: Diagnosis not present

## 2020-02-01 DIAGNOSIS — M6281 Muscle weakness (generalized): Secondary | ICD-10-CM | POA: Diagnosis not present

## 2020-02-01 DIAGNOSIS — R011 Cardiac murmur, unspecified: Secondary | ICD-10-CM | POA: Diagnosis not present

## 2020-02-01 DIAGNOSIS — N183 Chronic kidney disease, stage 3 unspecified: Secondary | ICD-10-CM | POA: Diagnosis not present

## 2020-02-01 DIAGNOSIS — C029 Malignant neoplasm of tongue, unspecified: Secondary | ICD-10-CM | POA: Diagnosis not present

## 2020-02-02 DIAGNOSIS — C028 Malignant neoplasm of overlapping sites of tongue: Secondary | ICD-10-CM | POA: Diagnosis not present

## 2020-02-02 DIAGNOSIS — Z510 Encounter for antineoplastic radiation therapy: Secondary | ICD-10-CM | POA: Diagnosis not present

## 2020-02-02 DIAGNOSIS — R220 Localized swelling, mass and lump, head: Secondary | ICD-10-CM | POA: Diagnosis not present

## 2020-02-05 DIAGNOSIS — R262 Difficulty in walking, not elsewhere classified: Secondary | ICD-10-CM | POA: Diagnosis not present

## 2020-02-05 DIAGNOSIS — N183 Chronic kidney disease, stage 3 unspecified: Secondary | ICD-10-CM | POA: Diagnosis not present

## 2020-02-05 DIAGNOSIS — C029 Malignant neoplasm of tongue, unspecified: Secondary | ICD-10-CM | POA: Diagnosis not present

## 2020-02-05 DIAGNOSIS — I129 Hypertensive chronic kidney disease with stage 1 through stage 4 chronic kidney disease, or unspecified chronic kidney disease: Secondary | ICD-10-CM | POA: Diagnosis not present

## 2020-02-05 DIAGNOSIS — H409 Unspecified glaucoma: Secondary | ICD-10-CM | POA: Diagnosis not present

## 2020-02-05 DIAGNOSIS — R011 Cardiac murmur, unspecified: Secondary | ICD-10-CM | POA: Diagnosis not present

## 2020-02-05 DIAGNOSIS — M6281 Muscle weakness (generalized): Secondary | ICD-10-CM | POA: Diagnosis not present

## 2020-02-06 DIAGNOSIS — R011 Cardiac murmur, unspecified: Secondary | ICD-10-CM | POA: Diagnosis not present

## 2020-02-06 DIAGNOSIS — R262 Difficulty in walking, not elsewhere classified: Secondary | ICD-10-CM | POA: Diagnosis not present

## 2020-02-06 DIAGNOSIS — I129 Hypertensive chronic kidney disease with stage 1 through stage 4 chronic kidney disease, or unspecified chronic kidney disease: Secondary | ICD-10-CM | POA: Diagnosis not present

## 2020-02-06 DIAGNOSIS — H409 Unspecified glaucoma: Secondary | ICD-10-CM | POA: Diagnosis not present

## 2020-02-06 DIAGNOSIS — M6281 Muscle weakness (generalized): Secondary | ICD-10-CM | POA: Diagnosis not present

## 2020-02-06 DIAGNOSIS — N183 Chronic kidney disease, stage 3 unspecified: Secondary | ICD-10-CM | POA: Diagnosis not present

## 2020-02-06 DIAGNOSIS — C029 Malignant neoplasm of tongue, unspecified: Secondary | ICD-10-CM | POA: Diagnosis not present

## 2020-02-07 DIAGNOSIS — D72829 Elevated white blood cell count, unspecified: Secondary | ICD-10-CM | POA: Diagnosis not present

## 2020-02-07 DIAGNOSIS — Z7982 Long term (current) use of aspirin: Secondary | ICD-10-CM | POA: Diagnosis not present

## 2020-02-07 DIAGNOSIS — N189 Chronic kidney disease, unspecified: Secondary | ICD-10-CM | POA: Diagnosis not present

## 2020-02-07 DIAGNOSIS — Z79890 Hormone replacement therapy (postmenopausal): Secondary | ICD-10-CM | POA: Diagnosis not present

## 2020-02-07 DIAGNOSIS — I129 Hypertensive chronic kidney disease with stage 1 through stage 4 chronic kidney disease, or unspecified chronic kidney disease: Secondary | ICD-10-CM | POA: Diagnosis not present

## 2020-02-07 DIAGNOSIS — R221 Localized swelling, mass and lump, neck: Secondary | ICD-10-CM | POA: Diagnosis not present

## 2020-02-07 DIAGNOSIS — E039 Hypothyroidism, unspecified: Secondary | ICD-10-CM | POA: Diagnosis not present

## 2020-02-07 DIAGNOSIS — C029 Malignant neoplasm of tongue, unspecified: Secondary | ICD-10-CM | POA: Diagnosis not present

## 2020-02-07 DIAGNOSIS — Z5111 Encounter for antineoplastic chemotherapy: Secondary | ICD-10-CM | POA: Diagnosis not present

## 2020-02-07 DIAGNOSIS — C021 Malignant neoplasm of border of tongue: Secondary | ICD-10-CM | POA: Diagnosis not present

## 2020-02-08 DIAGNOSIS — C029 Malignant neoplasm of tongue, unspecified: Secondary | ICD-10-CM | POA: Diagnosis not present

## 2020-02-08 DIAGNOSIS — R011 Cardiac murmur, unspecified: Secondary | ICD-10-CM | POA: Diagnosis not present

## 2020-02-08 DIAGNOSIS — N183 Chronic kidney disease, stage 3 unspecified: Secondary | ICD-10-CM | POA: Diagnosis not present

## 2020-02-08 DIAGNOSIS — R262 Difficulty in walking, not elsewhere classified: Secondary | ICD-10-CM | POA: Diagnosis not present

## 2020-02-08 DIAGNOSIS — I129 Hypertensive chronic kidney disease with stage 1 through stage 4 chronic kidney disease, or unspecified chronic kidney disease: Secondary | ICD-10-CM | POA: Diagnosis not present

## 2020-02-08 DIAGNOSIS — H409 Unspecified glaucoma: Secondary | ICD-10-CM | POA: Diagnosis not present

## 2020-02-08 DIAGNOSIS — M6281 Muscle weakness (generalized): Secondary | ICD-10-CM | POA: Diagnosis not present

## 2020-02-12 DIAGNOSIS — R011 Cardiac murmur, unspecified: Secondary | ICD-10-CM | POA: Diagnosis not present

## 2020-02-12 DIAGNOSIS — N183 Chronic kidney disease, stage 3 unspecified: Secondary | ICD-10-CM | POA: Diagnosis not present

## 2020-02-12 DIAGNOSIS — H409 Unspecified glaucoma: Secondary | ICD-10-CM | POA: Diagnosis not present

## 2020-02-12 DIAGNOSIS — I129 Hypertensive chronic kidney disease with stage 1 through stage 4 chronic kidney disease, or unspecified chronic kidney disease: Secondary | ICD-10-CM | POA: Diagnosis not present

## 2020-02-12 DIAGNOSIS — R262 Difficulty in walking, not elsewhere classified: Secondary | ICD-10-CM | POA: Diagnosis not present

## 2020-02-12 DIAGNOSIS — C029 Malignant neoplasm of tongue, unspecified: Secondary | ICD-10-CM | POA: Diagnosis not present

## 2020-02-12 DIAGNOSIS — M6281 Muscle weakness (generalized): Secondary | ICD-10-CM | POA: Diagnosis not present

## 2020-02-13 DIAGNOSIS — I129 Hypertensive chronic kidney disease with stage 1 through stage 4 chronic kidney disease, or unspecified chronic kidney disease: Secondary | ICD-10-CM | POA: Diagnosis not present

## 2020-02-13 DIAGNOSIS — N183 Chronic kidney disease, stage 3 unspecified: Secondary | ICD-10-CM | POA: Diagnosis not present

## 2020-02-13 DIAGNOSIS — M6281 Muscle weakness (generalized): Secondary | ICD-10-CM | POA: Diagnosis not present

## 2020-02-13 DIAGNOSIS — C029 Malignant neoplasm of tongue, unspecified: Secondary | ICD-10-CM | POA: Diagnosis not present

## 2020-02-13 DIAGNOSIS — R011 Cardiac murmur, unspecified: Secondary | ICD-10-CM | POA: Diagnosis not present

## 2020-02-13 DIAGNOSIS — R262 Difficulty in walking, not elsewhere classified: Secondary | ICD-10-CM | POA: Diagnosis not present

## 2020-02-13 DIAGNOSIS — H409 Unspecified glaucoma: Secondary | ICD-10-CM | POA: Diagnosis not present

## 2020-02-15 DIAGNOSIS — R262 Difficulty in walking, not elsewhere classified: Secondary | ICD-10-CM | POA: Diagnosis not present

## 2020-02-15 DIAGNOSIS — C029 Malignant neoplasm of tongue, unspecified: Secondary | ICD-10-CM | POA: Diagnosis not present

## 2020-02-15 DIAGNOSIS — N183 Chronic kidney disease, stage 3 unspecified: Secondary | ICD-10-CM | POA: Diagnosis not present

## 2020-02-15 DIAGNOSIS — H409 Unspecified glaucoma: Secondary | ICD-10-CM | POA: Diagnosis not present

## 2020-02-15 DIAGNOSIS — R011 Cardiac murmur, unspecified: Secondary | ICD-10-CM | POA: Diagnosis not present

## 2020-02-15 DIAGNOSIS — I129 Hypertensive chronic kidney disease with stage 1 through stage 4 chronic kidney disease, or unspecified chronic kidney disease: Secondary | ICD-10-CM | POA: Diagnosis not present

## 2020-02-15 DIAGNOSIS — M6281 Muscle weakness (generalized): Secondary | ICD-10-CM | POA: Diagnosis not present

## 2020-02-16 DIAGNOSIS — D0007 Carcinoma in situ of tongue: Secondary | ICD-10-CM

## 2020-02-16 DIAGNOSIS — C7800 Secondary malignant neoplasm of unspecified lung: Secondary | ICD-10-CM | POA: Diagnosis not present

## 2020-02-16 DIAGNOSIS — E039 Hypothyroidism, unspecified: Secondary | ICD-10-CM | POA: Diagnosis not present

## 2020-02-16 DIAGNOSIS — F39 Unspecified mood [affective] disorder: Secondary | ICD-10-CM | POA: Diagnosis not present

## 2020-02-16 DIAGNOSIS — E43 Unspecified severe protein-calorie malnutrition: Secondary | ICD-10-CM | POA: Diagnosis not present

## 2020-02-16 DIAGNOSIS — I10 Essential (primary) hypertension: Secondary | ICD-10-CM | POA: Diagnosis not present

## 2020-02-19 DIAGNOSIS — R011 Cardiac murmur, unspecified: Secondary | ICD-10-CM | POA: Diagnosis not present

## 2020-02-19 DIAGNOSIS — N183 Chronic kidney disease, stage 3 unspecified: Secondary | ICD-10-CM | POA: Diagnosis not present

## 2020-02-19 DIAGNOSIS — M6281 Muscle weakness (generalized): Secondary | ICD-10-CM | POA: Diagnosis not present

## 2020-02-19 DIAGNOSIS — I129 Hypertensive chronic kidney disease with stage 1 through stage 4 chronic kidney disease, or unspecified chronic kidney disease: Secondary | ICD-10-CM | POA: Diagnosis not present

## 2020-02-19 DIAGNOSIS — R262 Difficulty in walking, not elsewhere classified: Secondary | ICD-10-CM | POA: Diagnosis not present

## 2020-02-19 DIAGNOSIS — C029 Malignant neoplasm of tongue, unspecified: Secondary | ICD-10-CM | POA: Diagnosis not present

## 2020-02-19 DIAGNOSIS — H409 Unspecified glaucoma: Secondary | ICD-10-CM | POA: Diagnosis not present

## 2020-02-20 DIAGNOSIS — M6281 Muscle weakness (generalized): Secondary | ICD-10-CM | POA: Diagnosis not present

## 2020-02-20 DIAGNOSIS — I129 Hypertensive chronic kidney disease with stage 1 through stage 4 chronic kidney disease, or unspecified chronic kidney disease: Secondary | ICD-10-CM | POA: Diagnosis not present

## 2020-02-20 DIAGNOSIS — R262 Difficulty in walking, not elsewhere classified: Secondary | ICD-10-CM | POA: Diagnosis not present

## 2020-02-20 DIAGNOSIS — H409 Unspecified glaucoma: Secondary | ICD-10-CM | POA: Diagnosis not present

## 2020-02-20 DIAGNOSIS — N183 Chronic kidney disease, stage 3 unspecified: Secondary | ICD-10-CM | POA: Diagnosis not present

## 2020-02-20 DIAGNOSIS — R011 Cardiac murmur, unspecified: Secondary | ICD-10-CM | POA: Diagnosis not present

## 2020-02-20 DIAGNOSIS — C029 Malignant neoplasm of tongue, unspecified: Secondary | ICD-10-CM | POA: Diagnosis not present

## 2020-02-21 DIAGNOSIS — Z792 Long term (current) use of antibiotics: Secondary | ICD-10-CM | POA: Diagnosis not present

## 2020-02-21 DIAGNOSIS — Z800 Family history of malignant neoplasm of digestive organs: Secondary | ICD-10-CM | POA: Diagnosis not present

## 2020-02-21 DIAGNOSIS — C021 Malignant neoplasm of border of tongue: Secondary | ICD-10-CM | POA: Diagnosis not present

## 2020-02-21 DIAGNOSIS — Z801 Family history of malignant neoplasm of trachea, bronchus and lung: Secondary | ICD-10-CM | POA: Diagnosis not present

## 2020-02-21 DIAGNOSIS — I129 Hypertensive chronic kidney disease with stage 1 through stage 4 chronic kidney disease, or unspecified chronic kidney disease: Secondary | ICD-10-CM | POA: Diagnosis not present

## 2020-02-21 DIAGNOSIS — R221 Localized swelling, mass and lump, neck: Secondary | ICD-10-CM | POA: Diagnosis not present

## 2020-02-21 DIAGNOSIS — Z79899 Other long term (current) drug therapy: Secondary | ICD-10-CM | POA: Diagnosis not present

## 2020-02-21 DIAGNOSIS — C029 Malignant neoplasm of tongue, unspecified: Secondary | ICD-10-CM | POA: Diagnosis not present

## 2020-02-21 DIAGNOSIS — E039 Hypothyroidism, unspecified: Secondary | ICD-10-CM | POA: Diagnosis not present

## 2020-02-21 DIAGNOSIS — Z5112 Encounter for antineoplastic immunotherapy: Secondary | ICD-10-CM | POA: Diagnosis not present

## 2020-02-21 DIAGNOSIS — R0602 Shortness of breath: Secondary | ICD-10-CM | POA: Diagnosis not present

## 2020-02-22 DIAGNOSIS — R011 Cardiac murmur, unspecified: Secondary | ICD-10-CM | POA: Diagnosis not present

## 2020-02-22 DIAGNOSIS — M6281 Muscle weakness (generalized): Secondary | ICD-10-CM | POA: Diagnosis not present

## 2020-02-22 DIAGNOSIS — I129 Hypertensive chronic kidney disease with stage 1 through stage 4 chronic kidney disease, or unspecified chronic kidney disease: Secondary | ICD-10-CM | POA: Diagnosis not present

## 2020-02-22 DIAGNOSIS — N183 Chronic kidney disease, stage 3 unspecified: Secondary | ICD-10-CM | POA: Diagnosis not present

## 2020-02-22 DIAGNOSIS — H409 Unspecified glaucoma: Secondary | ICD-10-CM | POA: Diagnosis not present

## 2020-02-22 DIAGNOSIS — R262 Difficulty in walking, not elsewhere classified: Secondary | ICD-10-CM | POA: Diagnosis not present

## 2020-02-22 DIAGNOSIS — C029 Malignant neoplasm of tongue, unspecified: Secondary | ICD-10-CM | POA: Diagnosis not present

## 2020-02-26 DIAGNOSIS — M6281 Muscle weakness (generalized): Secondary | ICD-10-CM | POA: Diagnosis not present

## 2020-02-26 DIAGNOSIS — R262 Difficulty in walking, not elsewhere classified: Secondary | ICD-10-CM | POA: Diagnosis not present

## 2020-02-26 DIAGNOSIS — I129 Hypertensive chronic kidney disease with stage 1 through stage 4 chronic kidney disease, or unspecified chronic kidney disease: Secondary | ICD-10-CM | POA: Diagnosis not present

## 2020-02-26 DIAGNOSIS — H409 Unspecified glaucoma: Secondary | ICD-10-CM | POA: Diagnosis not present

## 2020-02-26 DIAGNOSIS — R011 Cardiac murmur, unspecified: Secondary | ICD-10-CM | POA: Diagnosis not present

## 2020-02-26 DIAGNOSIS — C029 Malignant neoplasm of tongue, unspecified: Secondary | ICD-10-CM | POA: Diagnosis not present

## 2020-02-26 DIAGNOSIS — N183 Chronic kidney disease, stage 3 unspecified: Secondary | ICD-10-CM | POA: Diagnosis not present

## 2020-02-27 DIAGNOSIS — H401133 Primary open-angle glaucoma, bilateral, severe stage: Secondary | ICD-10-CM | POA: Diagnosis not present

## 2020-03-06 DIAGNOSIS — T451X5A Adverse effect of antineoplastic and immunosuppressive drugs, initial encounter: Secondary | ICD-10-CM | POA: Diagnosis not present

## 2020-03-06 DIAGNOSIS — T451X5S Adverse effect of antineoplastic and immunosuppressive drugs, sequela: Secondary | ICD-10-CM | POA: Diagnosis not present

## 2020-03-06 DIAGNOSIS — L270 Generalized skin eruption due to drugs and medicaments taken internally: Secondary | ICD-10-CM | POA: Diagnosis not present

## 2020-03-06 DIAGNOSIS — D701 Agranulocytosis secondary to cancer chemotherapy: Secondary | ICD-10-CM | POA: Diagnosis not present

## 2020-03-06 DIAGNOSIS — C021 Malignant neoplasm of border of tongue: Secondary | ICD-10-CM | POA: Diagnosis not present

## 2020-03-06 DIAGNOSIS — R5383 Other fatigue: Secondary | ICD-10-CM | POA: Diagnosis not present

## 2020-03-06 DIAGNOSIS — Z79899 Other long term (current) drug therapy: Secondary | ICD-10-CM | POA: Diagnosis not present

## 2020-03-06 DIAGNOSIS — Z7952 Long term (current) use of systemic steroids: Secondary | ICD-10-CM | POA: Diagnosis not present

## 2020-03-06 DIAGNOSIS — Z5112 Encounter for antineoplastic immunotherapy: Secondary | ICD-10-CM | POA: Diagnosis not present

## 2020-03-19 DIAGNOSIS — R062 Wheezing: Secondary | ICD-10-CM | POA: Diagnosis not present

## 2020-03-20 DIAGNOSIS — Z79899 Other long term (current) drug therapy: Secondary | ICD-10-CM | POA: Diagnosis not present

## 2020-03-20 DIAGNOSIS — C799 Secondary malignant neoplasm of unspecified site: Secondary | ICD-10-CM | POA: Diagnosis not present

## 2020-03-20 DIAGNOSIS — D701 Agranulocytosis secondary to cancer chemotherapy: Secondary | ICD-10-CM | POA: Diagnosis not present

## 2020-03-20 DIAGNOSIS — T451X5A Adverse effect of antineoplastic and immunosuppressive drugs, initial encounter: Secondary | ICD-10-CM | POA: Diagnosis not present

## 2020-03-20 DIAGNOSIS — R5383 Other fatigue: Secondary | ICD-10-CM | POA: Diagnosis not present

## 2020-03-20 DIAGNOSIS — R918 Other nonspecific abnormal finding of lung field: Secondary | ICD-10-CM | POA: Diagnosis not present

## 2020-03-20 DIAGNOSIS — C021 Malignant neoplasm of border of tongue: Secondary | ICD-10-CM | POA: Diagnosis not present

## 2020-03-27 DIAGNOSIS — E039 Hypothyroidism, unspecified: Secondary | ICD-10-CM | POA: Diagnosis not present

## 2020-03-27 DIAGNOSIS — Z5111 Encounter for antineoplastic chemotherapy: Secondary | ICD-10-CM | POA: Diagnosis not present

## 2020-03-27 DIAGNOSIS — R21 Rash and other nonspecific skin eruption: Secondary | ICD-10-CM | POA: Diagnosis not present

## 2020-03-27 DIAGNOSIS — G893 Neoplasm related pain (acute) (chronic): Secondary | ICD-10-CM | POA: Diagnosis not present

## 2020-03-27 DIAGNOSIS — J984 Other disorders of lung: Secondary | ICD-10-CM | POA: Diagnosis not present

## 2020-03-27 DIAGNOSIS — D709 Neutropenia, unspecified: Secondary | ICD-10-CM | POA: Diagnosis not present

## 2020-03-27 DIAGNOSIS — N189 Chronic kidney disease, unspecified: Secondary | ICD-10-CM | POA: Diagnosis not present

## 2020-03-27 DIAGNOSIS — C021 Malignant neoplasm of border of tongue: Secondary | ICD-10-CM | POA: Diagnosis not present

## 2020-03-27 DIAGNOSIS — R112 Nausea with vomiting, unspecified: Secondary | ICD-10-CM | POA: Diagnosis not present

## 2020-04-05 DIAGNOSIS — I872 Venous insufficiency (chronic) (peripheral): Secondary | ICD-10-CM | POA: Diagnosis not present

## 2020-04-08 DIAGNOSIS — I959 Hypotension, unspecified: Secondary | ICD-10-CM | POA: Diagnosis not present

## 2020-04-11 DIAGNOSIS — E441 Mild protein-calorie malnutrition: Secondary | ICD-10-CM | POA: Diagnosis not present

## 2020-04-11 DIAGNOSIS — I10 Essential (primary) hypertension: Secondary | ICD-10-CM | POA: Diagnosis not present

## 2020-04-11 DIAGNOSIS — D0007 Carcinoma in situ of tongue: Secondary | ICD-10-CM | POA: Diagnosis not present

## 2020-04-11 DIAGNOSIS — I87313 Chronic venous hypertension (idiopathic) with ulcer of bilateral lower extremity: Secondary | ICD-10-CM | POA: Diagnosis not present

## 2020-04-11 DIAGNOSIS — C7800 Secondary malignant neoplasm of unspecified lung: Secondary | ICD-10-CM | POA: Diagnosis not present

## 2020-04-17 DIAGNOSIS — E43 Unspecified severe protein-calorie malnutrition: Secondary | ICD-10-CM | POA: Diagnosis not present

## 2020-04-17 DIAGNOSIS — E039 Hypothyroidism, unspecified: Secondary | ICD-10-CM | POA: Diagnosis not present

## 2020-04-17 DIAGNOSIS — F39 Unspecified mood [affective] disorder: Secondary | ICD-10-CM | POA: Diagnosis not present

## 2020-04-17 DIAGNOSIS — C021 Malignant neoplasm of border of tongue: Secondary | ICD-10-CM | POA: Diagnosis not present

## 2020-04-17 DIAGNOSIS — C7800 Secondary malignant neoplasm of unspecified lung: Secondary | ICD-10-CM | POA: Diagnosis not present

## 2020-04-17 DIAGNOSIS — I10 Essential (primary) hypertension: Secondary | ICD-10-CM | POA: Diagnosis not present

## 2020-04-17 DIAGNOSIS — C029 Malignant neoplasm of tongue, unspecified: Secondary | ICD-10-CM

## 2020-04-22 DIAGNOSIS — R300 Dysuria: Secondary | ICD-10-CM | POA: Diagnosis not present

## 2020-04-23 DIAGNOSIS — N183 Chronic kidney disease, stage 3 unspecified: Secondary | ICD-10-CM | POA: Diagnosis not present

## 2020-04-23 DIAGNOSIS — I129 Hypertensive chronic kidney disease with stage 1 through stage 4 chronic kidney disease, or unspecified chronic kidney disease: Secondary | ICD-10-CM | POA: Diagnosis not present

## 2020-04-23 DIAGNOSIS — C029 Malignant neoplasm of tongue, unspecified: Secondary | ICD-10-CM | POA: Diagnosis not present

## 2020-04-23 DIAGNOSIS — L0390 Cellulitis, unspecified: Secondary | ICD-10-CM | POA: Diagnosis not present

## 2020-04-23 DIAGNOSIS — M6281 Muscle weakness (generalized): Secondary | ICD-10-CM | POA: Diagnosis not present

## 2020-04-23 DIAGNOSIS — L97909 Non-pressure chronic ulcer of unspecified part of unspecified lower leg with unspecified severity: Secondary | ICD-10-CM | POA: Diagnosis not present

## 2020-04-23 DIAGNOSIS — R011 Cardiac murmur, unspecified: Secondary | ICD-10-CM | POA: Diagnosis not present

## 2020-04-23 DIAGNOSIS — R262 Difficulty in walking, not elsewhere classified: Secondary | ICD-10-CM | POA: Diagnosis not present

## 2020-04-23 DIAGNOSIS — H409 Unspecified glaucoma: Secondary | ICD-10-CM | POA: Diagnosis not present

## 2020-04-25 DIAGNOSIS — N183 Chronic kidney disease, stage 3 unspecified: Secondary | ICD-10-CM | POA: Diagnosis not present

## 2020-04-25 DIAGNOSIS — C029 Malignant neoplasm of tongue, unspecified: Secondary | ICD-10-CM | POA: Diagnosis not present

## 2020-04-25 DIAGNOSIS — H409 Unspecified glaucoma: Secondary | ICD-10-CM | POA: Diagnosis not present

## 2020-04-25 DIAGNOSIS — M6281 Muscle weakness (generalized): Secondary | ICD-10-CM | POA: Diagnosis not present

## 2020-04-25 DIAGNOSIS — R011 Cardiac murmur, unspecified: Secondary | ICD-10-CM | POA: Diagnosis not present

## 2020-04-25 DIAGNOSIS — I129 Hypertensive chronic kidney disease with stage 1 through stage 4 chronic kidney disease, or unspecified chronic kidney disease: Secondary | ICD-10-CM | POA: Diagnosis not present

## 2020-04-25 DIAGNOSIS — R262 Difficulty in walking, not elsewhere classified: Secondary | ICD-10-CM | POA: Diagnosis not present

## 2020-04-25 DIAGNOSIS — R300 Dysuria: Secondary | ICD-10-CM | POA: Diagnosis not present

## 2020-04-29 DIAGNOSIS — C029 Malignant neoplasm of tongue, unspecified: Secondary | ICD-10-CM | POA: Diagnosis not present

## 2020-04-29 DIAGNOSIS — M6281 Muscle weakness (generalized): Secondary | ICD-10-CM | POA: Diagnosis not present

## 2020-04-29 DIAGNOSIS — R011 Cardiac murmur, unspecified: Secondary | ICD-10-CM | POA: Diagnosis not present

## 2020-04-29 DIAGNOSIS — H409 Unspecified glaucoma: Secondary | ICD-10-CM | POA: Diagnosis not present

## 2020-04-29 DIAGNOSIS — R262 Difficulty in walking, not elsewhere classified: Secondary | ICD-10-CM | POA: Diagnosis not present

## 2020-04-29 DIAGNOSIS — N183 Chronic kidney disease, stage 3 unspecified: Secondary | ICD-10-CM | POA: Diagnosis not present

## 2020-04-29 DIAGNOSIS — I129 Hypertensive chronic kidney disease with stage 1 through stage 4 chronic kidney disease, or unspecified chronic kidney disease: Secondary | ICD-10-CM | POA: Diagnosis not present

## 2020-04-30 DIAGNOSIS — M6281 Muscle weakness (generalized): Secondary | ICD-10-CM | POA: Diagnosis not present

## 2020-04-30 DIAGNOSIS — R262 Difficulty in walking, not elsewhere classified: Secondary | ICD-10-CM | POA: Diagnosis not present

## 2020-04-30 DIAGNOSIS — I129 Hypertensive chronic kidney disease with stage 1 through stage 4 chronic kidney disease, or unspecified chronic kidney disease: Secondary | ICD-10-CM | POA: Diagnosis not present

## 2020-04-30 DIAGNOSIS — R011 Cardiac murmur, unspecified: Secondary | ICD-10-CM | POA: Diagnosis not present

## 2020-04-30 DIAGNOSIS — H409 Unspecified glaucoma: Secondary | ICD-10-CM | POA: Diagnosis not present

## 2020-04-30 DIAGNOSIS — C029 Malignant neoplasm of tongue, unspecified: Secondary | ICD-10-CM | POA: Diagnosis not present

## 2020-04-30 DIAGNOSIS — N183 Chronic kidney disease, stage 3 unspecified: Secondary | ICD-10-CM | POA: Diagnosis not present

## 2020-05-03 DIAGNOSIS — S81801D Unspecified open wound, right lower leg, subsequent encounter: Secondary | ICD-10-CM | POA: Diagnosis not present

## 2020-05-03 DIAGNOSIS — R945 Abnormal results of liver function studies: Secondary | ICD-10-CM | POA: Diagnosis not present

## 2020-05-06 DIAGNOSIS — I129 Hypertensive chronic kidney disease with stage 1 through stage 4 chronic kidney disease, or unspecified chronic kidney disease: Secondary | ICD-10-CM | POA: Diagnosis not present

## 2020-05-06 DIAGNOSIS — M6281 Muscle weakness (generalized): Secondary | ICD-10-CM | POA: Diagnosis not present

## 2020-05-06 DIAGNOSIS — R262 Difficulty in walking, not elsewhere classified: Secondary | ICD-10-CM | POA: Diagnosis not present

## 2020-05-06 DIAGNOSIS — C029 Malignant neoplasm of tongue, unspecified: Secondary | ICD-10-CM | POA: Diagnosis not present

## 2020-05-06 DIAGNOSIS — R2681 Unsteadiness on feet: Secondary | ICD-10-CM | POA: Diagnosis not present

## 2020-05-06 DIAGNOSIS — H409 Unspecified glaucoma: Secondary | ICD-10-CM | POA: Diagnosis not present

## 2020-05-06 DIAGNOSIS — R011 Cardiac murmur, unspecified: Secondary | ICD-10-CM | POA: Diagnosis not present

## 2020-05-06 DIAGNOSIS — N183 Chronic kidney disease, stage 3 unspecified: Secondary | ICD-10-CM | POA: Diagnosis not present

## 2020-05-07 DIAGNOSIS — R2681 Unsteadiness on feet: Secondary | ICD-10-CM | POA: Diagnosis not present

## 2020-05-07 DIAGNOSIS — M6281 Muscle weakness (generalized): Secondary | ICD-10-CM | POA: Diagnosis not present

## 2020-05-07 DIAGNOSIS — I129 Hypertensive chronic kidney disease with stage 1 through stage 4 chronic kidney disease, or unspecified chronic kidney disease: Secondary | ICD-10-CM | POA: Diagnosis not present

## 2020-05-07 DIAGNOSIS — N183 Chronic kidney disease, stage 3 unspecified: Secondary | ICD-10-CM | POA: Diagnosis not present

## 2020-05-07 DIAGNOSIS — C029 Malignant neoplasm of tongue, unspecified: Secondary | ICD-10-CM | POA: Diagnosis not present

## 2020-05-07 DIAGNOSIS — R011 Cardiac murmur, unspecified: Secondary | ICD-10-CM | POA: Diagnosis not present

## 2020-05-07 DIAGNOSIS — H409 Unspecified glaucoma: Secondary | ICD-10-CM | POA: Diagnosis not present

## 2020-05-07 DIAGNOSIS — R262 Difficulty in walking, not elsewhere classified: Secondary | ICD-10-CM | POA: Diagnosis not present

## 2020-05-10 DIAGNOSIS — B351 Tinea unguium: Secondary | ICD-10-CM | POA: Diagnosis not present

## 2020-05-10 DIAGNOSIS — E119 Type 2 diabetes mellitus without complications: Secondary | ICD-10-CM | POA: Diagnosis not present

## 2020-05-10 DIAGNOSIS — I10 Essential (primary) hypertension: Secondary | ICD-10-CM | POA: Diagnosis not present

## 2020-05-15 DIAGNOSIS — R011 Cardiac murmur, unspecified: Secondary | ICD-10-CM | POA: Diagnosis not present

## 2020-05-15 DIAGNOSIS — M6281 Muscle weakness (generalized): Secondary | ICD-10-CM | POA: Diagnosis not present

## 2020-05-15 DIAGNOSIS — I129 Hypertensive chronic kidney disease with stage 1 through stage 4 chronic kidney disease, or unspecified chronic kidney disease: Secondary | ICD-10-CM | POA: Diagnosis not present

## 2020-05-15 DIAGNOSIS — N183 Chronic kidney disease, stage 3 unspecified: Secondary | ICD-10-CM | POA: Diagnosis not present

## 2020-05-15 DIAGNOSIS — H409 Unspecified glaucoma: Secondary | ICD-10-CM | POA: Diagnosis not present

## 2020-05-15 DIAGNOSIS — R262 Difficulty in walking, not elsewhere classified: Secondary | ICD-10-CM | POA: Diagnosis not present

## 2020-05-15 DIAGNOSIS — C021 Malignant neoplasm of border of tongue: Secondary | ICD-10-CM | POA: Diagnosis not present

## 2020-05-15 DIAGNOSIS — R2681 Unsteadiness on feet: Secondary | ICD-10-CM | POA: Diagnosis not present

## 2020-05-15 DIAGNOSIS — C029 Malignant neoplasm of tongue, unspecified: Secondary | ICD-10-CM | POA: Diagnosis not present

## 2020-05-20 DIAGNOSIS — M6281 Muscle weakness (generalized): Secondary | ICD-10-CM | POA: Diagnosis not present

## 2020-05-20 DIAGNOSIS — R011 Cardiac murmur, unspecified: Secondary | ICD-10-CM | POA: Diagnosis not present

## 2020-05-20 DIAGNOSIS — R262 Difficulty in walking, not elsewhere classified: Secondary | ICD-10-CM | POA: Diagnosis not present

## 2020-05-20 DIAGNOSIS — C029 Malignant neoplasm of tongue, unspecified: Secondary | ICD-10-CM | POA: Diagnosis not present

## 2020-05-20 DIAGNOSIS — R2681 Unsteadiness on feet: Secondary | ICD-10-CM | POA: Diagnosis not present

## 2020-05-20 DIAGNOSIS — I129 Hypertensive chronic kidney disease with stage 1 through stage 4 chronic kidney disease, or unspecified chronic kidney disease: Secondary | ICD-10-CM | POA: Diagnosis not present

## 2020-05-20 DIAGNOSIS — N183 Chronic kidney disease, stage 3 unspecified: Secondary | ICD-10-CM | POA: Diagnosis not present

## 2020-05-20 DIAGNOSIS — H409 Unspecified glaucoma: Secondary | ICD-10-CM | POA: Diagnosis not present

## 2020-05-21 DIAGNOSIS — R339 Retention of urine, unspecified: Secondary | ICD-10-CM | POA: Diagnosis not present

## 2020-05-21 DIAGNOSIS — R2681 Unsteadiness on feet: Secondary | ICD-10-CM | POA: Diagnosis not present

## 2020-05-21 DIAGNOSIS — M6281 Muscle weakness (generalized): Secondary | ICD-10-CM | POA: Diagnosis not present

## 2020-05-21 DIAGNOSIS — C029 Malignant neoplasm of tongue, unspecified: Secondary | ICD-10-CM | POA: Diagnosis not present

## 2020-05-21 DIAGNOSIS — I129 Hypertensive chronic kidney disease with stage 1 through stage 4 chronic kidney disease, or unspecified chronic kidney disease: Secondary | ICD-10-CM | POA: Diagnosis not present

## 2020-05-21 DIAGNOSIS — R011 Cardiac murmur, unspecified: Secondary | ICD-10-CM | POA: Diagnosis not present

## 2020-05-21 DIAGNOSIS — R262 Difficulty in walking, not elsewhere classified: Secondary | ICD-10-CM | POA: Diagnosis not present

## 2020-05-21 DIAGNOSIS — H409 Unspecified glaucoma: Secondary | ICD-10-CM | POA: Diagnosis not present

## 2020-05-21 DIAGNOSIS — N183 Chronic kidney disease, stage 3 unspecified: Secondary | ICD-10-CM | POA: Diagnosis not present

## 2020-05-23 DIAGNOSIS — K922 Gastrointestinal hemorrhage, unspecified: Secondary | ICD-10-CM

## 2020-05-23 DIAGNOSIS — I129 Hypertensive chronic kidney disease with stage 1 through stage 4 chronic kidney disease, or unspecified chronic kidney disease: Secondary | ICD-10-CM | POA: Diagnosis not present

## 2020-05-23 DIAGNOSIS — H409 Unspecified glaucoma: Secondary | ICD-10-CM | POA: Diagnosis not present

## 2020-05-23 DIAGNOSIS — C029 Malignant neoplasm of tongue, unspecified: Secondary | ICD-10-CM | POA: Diagnosis not present

## 2020-05-23 DIAGNOSIS — R339 Retention of urine, unspecified: Secondary | ICD-10-CM

## 2020-05-23 DIAGNOSIS — N183 Chronic kidney disease, stage 3 unspecified: Secondary | ICD-10-CM | POA: Diagnosis not present

## 2020-05-23 DIAGNOSIS — R262 Difficulty in walking, not elsewhere classified: Secondary | ICD-10-CM | POA: Diagnosis not present

## 2020-05-23 DIAGNOSIS — D0007 Carcinoma in situ of tongue: Secondary | ICD-10-CM

## 2020-05-23 DIAGNOSIS — R2681 Unsteadiness on feet: Secondary | ICD-10-CM | POA: Diagnosis not present

## 2020-05-23 DIAGNOSIS — R011 Cardiac murmur, unspecified: Secondary | ICD-10-CM | POA: Diagnosis not present

## 2020-05-23 DIAGNOSIS — M6281 Muscle weakness (generalized): Secondary | ICD-10-CM | POA: Diagnosis not present

## 2020-05-24 ENCOUNTER — Telehealth: Payer: Self-pay | Admitting: Internal Medicine

## 2020-06-05 NOTE — Telephone Encounter (Signed)
I just did it

## 2020-06-05 NOTE — Telephone Encounter (Signed)
TL patient passed away today. Patient is going to be cremated.  Rita from [place] called in and ask that we sign off as soon as possible. EM

## 2020-06-05 DEATH — deceased
